# Patient Record
Sex: Male | Born: 1990 | Race: Black or African American | Hispanic: No | Marital: Single | Smoking: Former smoker
Health system: Southern US, Community
[De-identification: ages and names within clinical notes are randomized; demographics above are authoritative.]

## PROBLEM LIST (undated history)

## (undated) DIAGNOSIS — F32A Depression, unspecified: Secondary | ICD-10-CM

## (undated) DIAGNOSIS — F329 Major depressive disorder, single episode, unspecified: Secondary | ICD-10-CM

## (undated) DIAGNOSIS — E109 Type 1 diabetes mellitus without complications: Secondary | ICD-10-CM

## (undated) HISTORY — DX: Type 1 diabetes mellitus without complications: E10.9

---

## 2011-10-13 ENCOUNTER — Inpatient Hospital Stay (HOSPITAL_BASED_OUTPATIENT_CLINIC_OR_DEPARTMENT_OTHER)
Admission: EM | Admit: 2011-10-13 | Discharge: 2011-10-15 | DRG: 638 | Disposition: A | Discharge status: Home / Self Care | Payer: MEDICAID | Attending: Internal Medicine | Admitting: Internal Medicine

## 2011-10-13 ENCOUNTER — Encounter (HOSPITAL_BASED_OUTPATIENT_CLINIC_OR_DEPARTMENT_OTHER): Payer: Self-pay | Admitting: Emergency Medicine

## 2011-10-13 DIAGNOSIS — R634 Abnormal weight loss: Secondary | ICD-10-CM | POA: Diagnosis present

## 2011-10-13 DIAGNOSIS — R7309 Other abnormal glucose: Secondary | ICD-10-CM

## 2011-10-13 DIAGNOSIS — E119 Type 2 diabetes mellitus without complications: Principal | ICD-10-CM

## 2011-10-13 DIAGNOSIS — E860 Dehydration: Secondary | ICD-10-CM | POA: Diagnosis present

## 2011-10-13 DIAGNOSIS — R739 Hyperglycemia, unspecified: Secondary | ICD-10-CM

## 2011-10-13 DIAGNOSIS — R42 Dizziness and giddiness: Secondary | ICD-10-CM | POA: Diagnosis present

## 2011-10-13 DIAGNOSIS — E871 Hypo-osmolality and hyponatremia: Secondary | ICD-10-CM | POA: Diagnosis present

## 2011-10-13 LAB — BASIC METABOLIC PANEL
CO2: 25 mEq/L (ref 19–32)
Chloride: 91 mEq/L — ABNORMAL LOW (ref 96–112)
Glucose, Bld: 759 mg/dL (ref 70–99)
Potassium: 4.7 mEq/L (ref 3.5–5.1)
Sodium: 131 mEq/L — ABNORMAL LOW (ref 135–145)

## 2011-10-13 LAB — CBC WITH DIFFERENTIAL/PLATELET
Hemoglobin: 16.2 g/dL (ref 13.0–17.0)
Lymphocytes Relative: 24 % (ref 12–46)
Lymphs Abs: 1.5 10*3/uL (ref 0.7–4.0)
Monocytes Relative: 10 % (ref 3–12)
Neutrophils Relative %: 66 % (ref 43–77)
Platelets: 182 10*3/uL (ref 150–400)
RBC: 5.81 MIL/uL (ref 4.22–5.81)
WBC: 6.1 10*3/uL (ref 4.0–10.5)

## 2011-10-13 LAB — URINE MICROSCOPIC-ADD ON: RBC / HPF: NONE SEEN RBC/hpf (ref <3)

## 2011-10-13 LAB — URINALYSIS, ROUTINE W REFLEX MICROSCOPIC
Hgb urine dipstick: NEGATIVE
Nitrite: NEGATIVE
Specific Gravity, Urine: 1.038 — ABNORMAL HIGH (ref 1.005–1.030)
Urobilinogen, UA: 0.2 mg/dL (ref 0.0–1.0)
pH: 6 (ref 5.0–8.0)

## 2011-10-13 LAB — GLUCOSE, CAPILLARY: Glucose-Capillary: 426 mg/dL — ABNORMAL HIGH (ref 70–99)

## 2011-10-13 MED ORDER — SODIUM CHLORIDE 0.9 % IV SOLN: INTRAVENOUS | Status: DC

## 2011-10-13 MED ORDER — ONDANSETRON HCL 4 MG/2ML IJ SOLN: 4.0000 mg | Freq: Four times a day (QID) | INTRAMUSCULAR | Status: DC | PRN

## 2011-10-13 MED ORDER — ONDANSETRON HCL 4 MG PO TABS: 4.0000 mg | ORAL_TABLET | Freq: Four times a day (QID) | ORAL | Status: DC | PRN

## 2011-10-13 MED ORDER — SODIUM CHLORIDE 0.9 % IJ SOLN
3.0000 mL | Freq: Two times a day (BID) | INTRAMUSCULAR | Status: DC
  Administered 2011-10-14 – 2011-10-15 (×2): 3 mL via INTRAVENOUS

## 2011-10-13 MED ORDER — SODIUM CHLORIDE 0.9 % IV SOLN
INTRAVENOUS | Status: DC
  Administered 2011-10-13 – 2011-10-14 (×2): via INTRAVENOUS

## 2011-10-13 MED ORDER — DEXTROSE 50 % IV SOLN
25.0000 mL | INTRAVENOUS | Status: DC | PRN
  Administered 2011-10-14: 14 mL via INTRAVENOUS
  Filled 2011-10-13: qty 50

## 2011-10-13 MED ORDER — ONDANSETRON HCL 4 MG/2ML IJ SOLN: 4.0000 mg | Freq: Three times a day (TID) | INTRAMUSCULAR | Status: AC | PRN

## 2011-10-13 MED ORDER — SODIUM CHLORIDE 0.9 % IV SOLN
INTRAVENOUS | Status: DC
  Administered 2011-10-13: via INTRAVENOUS
  Filled 2011-10-13: qty 1

## 2011-10-13 MED ORDER — INSULIN REGULAR BOLUS VIA INFUSION
0.0000 [IU] | Freq: Three times a day (TID) | INTRAVENOUS | Status: DC
  Filled 2011-10-13: qty 10

## 2011-10-13 MED ORDER — SODIUM CHLORIDE 0.9 % IV SOLN
1000.0000 mL | Freq: Once | INTRAVENOUS | Status: AC
  Administered 2011-10-13: 1000 mL via INTRAVENOUS

## 2011-10-13 MED ORDER — DEXTROSE-NACL 5-0.45 % IV SOLN
INTRAVENOUS | Status: DC
  Administered 2011-10-13: 23:00:00 via INTRAVENOUS

## 2011-10-13 MED ORDER — SODIUM CHLORIDE 0.9 % IV SOLN
1000.0000 mL | INTRAVENOUS | Status: DC
  Administered 2011-10-13: 1000 mL via INTRAVENOUS

## 2011-10-13 MED ORDER — INSULIN REGULAR HUMAN 100 UNIT/ML IJ SOLN
INTRAMUSCULAR | Status: AC
  Filled 2011-10-13: qty 1

## 2011-10-13 MED ORDER — SODIUM CHLORIDE 0.9 % IV SOLN
INTRAVENOUS | Status: DC
  Administered 2011-10-13: 7 [IU]/h via INTRAVENOUS

## 2011-10-13 MED ORDER — MORPHINE SULFATE 2 MG/ML IJ SOLN: 2.0000 mg | INTRAMUSCULAR | Status: DC | PRN

## 2011-10-13 NOTE — ED Notes (Addendum)
Critical lab Glucose 759 per Marcello Moores, [employer].

## 2011-10-13 NOTE — ED Provider Notes (Signed)
History     CSN: [id number]  Arrival date & time 10/13/11  1637   First MD Initiated Contact with Patient 10/13/11 1647      Chief Complaint  Patient presents with  . Dizziness  . Palpitations  . Urinary Frequency  . Weight Loss     Patient is a 21 y.o. male presenting with palpitations and frequency.  Palpitations   Urinary Frequency   Pt has been feeling abnormally for at least 2-3 years.  Pt has not had a check up in years.  Over this time he has noticed weight loss over this time.  Pt thinks he has lost weight in his face.  Normally he does weigh 135.  He has had increased thirst, urinating frequently.  He has had palpitations and dizziness over an extended period of time.  Pt felt like he had a panic attack today with his heart racing, feeling lightheaded and vision feeling blurred.  History reviewed. No pertinent past medical history.  History reviewed. No pertinent past surgical history.  FmHx: Mother HTN  History  Substance Use Topics  . Smoking status: Current Some Day Smoker  . Smokeless tobacco: Not on file  . Alcohol Use: Yes      Review of Systems  Cardiovascular: Positive for palpitations.  Genitourinary: Positive for frequency.  All other systems reviewed and are negative.    Allergies  Review of patient's allergies indicates not on file.  Home Medications  No current outpatient prescriptions on file.  BP 118/83  Pulse 89  Temp 97.8 F (36.6 C) (Oral)  Resp 16  Ht 5\' 10"  (1.778 m)  Wt 140 lb (63.504 kg)  BMI 20.09 kg/m2  SpO2 97%  Physical Exam  Nursing note and vitals reviewed. Constitutional: No distress.       thin  HENT:  Head: Normocephalic and atraumatic.  Right Ear: External ear normal.  Left Ear: External ear normal.  Eyes: Conjunctivae are normal. Right eye exhibits no discharge. Left eye exhibits no discharge. No scleral icterus.  Neck: Neck supple. No tracheal deviation present.  Cardiovascular: Normal rate, regular  rhythm and intact distal pulses.   Pulmonary/Chest: Effort normal and breath sounds normal. No stridor. No respiratory distress. He has no wheezes. He has no rales.  Abdominal: Soft. Bowel sounds are normal. He exhibits no distension. There is no tenderness. There is no rebound and no guarding.  Musculoskeletal: He exhibits no edema and no tenderness.  Neurological: He is alert. He has normal strength. No sensory deficit. Cranial nerve deficit:  no gross defecits noted. He exhibits normal muscle tone. He displays no seizure activity. Coordination normal.  Skin: Skin is warm and dry. No rash noted.  Psychiatric: He has a normal mood and affect.    ED Course  Procedures (including critical care time) EKG Sinus rhythm with marked sinus arrhythmia, rate 78 Right atrial enlargement ST elevation, probable early repolarization Borderline ECG No prior tracing to compare  Labs Reviewed  URINALYSIS, ROUTINE W REFLEX MICROSCOPIC - Abnormal; Notable for the following:    Specific Gravity, Urine 1.038 (*)     Glucose, UA >1000 (*)     Ketones, ur 15 (*)     All other components within normal limits  BASIC METABOLIC PANEL - Abnormal; Notable for the following:    Sodium 131 (*)     Chloride 91 (*)     Glucose, Bld 759 (*)     BUN 26 (*)     All other components  within normal limits  CBC WITH DIFFERENTIAL  URINE MICROSCOPIC-ADD ON  HIV ANTIBODY (ROUTINE TESTING)   No results found.   1. Hyperglycemia   2. Diabetes mellitus, new onset       MDM  The patient presents with new-onset diabetes. He does not have evidence of diabetic ketoacidosis however he is profoundly hyperglycemic. The patient does not have an established primary care physician .  He would not be able to followup with her primary care Dr. in the next day or 2 due to the proximity of the weekend. I will consult the hospitalist regarding admission for observation to regulate his blood sugar and to establish him on an  appropriate outpatient regimen to       Celene Kras, MD 10/13/11 1807

## 2011-10-13 NOTE — ED Notes (Signed)
Via carelink--spoke with Travis 

## 2011-10-13 NOTE — ED Notes (Signed)
States for a while has had increased urination, increased thirst, dizziness, palpitations and feelings of "uneasiness"

## 2011-10-13 NOTE — Progress Notes (Addendum)
Transfer Note  Transferring Facility: [facility]  Requesting physician: DR Linwood Dibbles, MD  Time of request: 6:30 pm  HPI: 21 year old male with no significant PMHx came in for dizziness, was found to be hyperglycemic, with Blood sugars in 700. He is apparently not in DKA.  He is a new onset Diabetes.   Vitals: within normal limits  Exam : reported within normal limits.   Plan:  Transfer to [facility] for management of hyperglycemia in a new onset diabetes.  Admit to MED SURG bed.   Kathlen Mody, MD  [facility]  [phone] 10/13/2011, 7:10 PM

## 2011-10-14 DIAGNOSIS — E860 Dehydration: Secondary | ICD-10-CM | POA: Diagnosis present

## 2011-10-14 DIAGNOSIS — R42 Dizziness and giddiness: Secondary | ICD-10-CM | POA: Diagnosis present

## 2011-10-14 DIAGNOSIS — R634 Abnormal weight loss: Secondary | ICD-10-CM | POA: Diagnosis present

## 2011-10-14 LAB — COMPREHENSIVE METABOLIC PANEL
ALT: 9 U/L (ref 0–53)
AST: 13 U/L (ref 0–37)
Albumin: 3.5 g/dL (ref 3.5–5.2)
Alkaline Phosphatase: 96 U/L (ref 39–117)
CO2: 25 mEq/L (ref 19–32)
Chloride: 103 mEq/L (ref 96–112)
Creatinine, Ser: 0.87 mg/dL (ref 0.50–1.35)
GFR calc non Af Amer: >90 mL/min (ref >90)
Potassium: 3.8 mEq/L (ref 3.5–5.1)
Sodium: 140 mEq/L (ref 135–145)
Total Bilirubin: 1.1 mg/dL (ref 0.3–1.2)

## 2011-10-14 LAB — CBC
MCH: 28.5 pg (ref 26.0–34.0)
MCHC: 35.1 g/dL (ref 30.0–36.0)
MCV: 81.1 fL (ref 78.0–100.0)
Platelets: 168 10*3/uL (ref 150–400)
RBC: 4.92 MIL/uL (ref 4.22–5.81)

## 2011-10-14 LAB — GLUCOSE, CAPILLARY
Glucose-Capillary: 121 mg/dL — ABNORMAL HIGH (ref 70–99)
Glucose-Capillary: 127 mg/dL — ABNORMAL HIGH (ref 70–99)
Glucose-Capillary: 183 mg/dL — ABNORMAL HIGH (ref 70–99)
Glucose-Capillary: 201 mg/dL — ABNORMAL HIGH (ref 70–99)
Glucose-Capillary: 295 mg/dL — ABNORMAL HIGH (ref 70–99)

## 2011-10-14 LAB — HEMOGLOBIN A1C: Hgb A1c MFr Bld: 10.1 % — ABNORMAL HIGH (ref <5.7)

## 2011-10-14 MED ORDER — INSULIN ASPART 100 UNIT/ML ~~LOC~~ SOLN
0.0000 [IU] | Freq: Three times a day (TID) | SUBCUTANEOUS | Status: DC
  Administered 2011-10-14: 18:00:00 via SUBCUTANEOUS
  Administered 2011-10-14: 1 [IU] via SUBCUTANEOUS
  Administered 2011-10-14 – 2011-10-15 (×2): 5 [IU] via SUBCUTANEOUS

## 2011-10-14 MED ORDER — INSULIN ASPART 100 UNIT/ML ~~LOC~~ SOLN
0.0000 [IU] | Freq: Every day | SUBCUTANEOUS | Status: DC
  Administered 2011-10-14: 3 [IU] via SUBCUTANEOUS

## 2011-10-14 MED ORDER — LIVING WELL WITH DIABETES BOOK
Freq: Once | Status: AC
  Administered 2011-10-14: 17:00:00
  Filled 2011-10-14: qty 1

## 2011-10-14 MED ORDER — INSULIN GLARGINE 100 UNIT/ML ~~LOC~~ SOLN
5.0000 [IU] | Freq: Every day | SUBCUTANEOUS | Status: DC
  Administered 2011-10-14: 5 [IU] via SUBCUTANEOUS

## 2011-10-14 MED ORDER — INSULIN ASPART 100 UNIT/ML ~~LOC~~ SOLN
2.0000 [IU] | Freq: Three times a day (TID) | SUBCUTANEOUS | Status: DC
  Administered 2011-10-14: 18:00:00 via SUBCUTANEOUS

## 2011-10-14 MED ORDER — SODIUM CHLORIDE 0.9 % IV SOLN
INTRAVENOUS | Status: AC
  Administered 2011-10-14: 15:00:00 via INTRAVENOUS

## 2011-10-14 MED ORDER — INSULIN GLARGINE 100 UNIT/ML ~~LOC~~ SOLN
10.0000 [IU] | Freq: Every day | SUBCUTANEOUS | Status: DC
  Administered 2011-10-14: 10 [IU] via SUBCUTANEOUS

## 2011-10-14 MED ORDER — INSULIN ASPART 100 UNIT/ML ~~LOC~~ SOLN
2.0000 [IU] | Freq: Once | SUBCUTANEOUS | Status: AC
  Administered 2011-10-14: 2 [IU] via SUBCUTANEOUS

## 2011-10-14 MED ORDER — INSULIN ASPART 100 UNIT/ML ~~LOC~~ SOLN
3.0000 [IU] | Freq: Three times a day (TID) | SUBCUTANEOUS | Status: DC
  Administered 2011-10-15: 3 [IU] via SUBCUTANEOUS

## 2011-10-14 NOTE — Progress Notes (Signed)
Subjective: Feeling better.  Blood sugars improved.  Objective: Vital signs in last 24 hours: Filed Vitals:   10/13/11 1912 10/13/11 2021 10/14/11 0143 10/14/11 0626  BP: 112/56 104/72 125/60 105/66  Pulse: 70 81 70 60  Temp:  97.9 F (36.6 C) 98.2 F (36.8 C) 97.8 F (36.6 C)  TempSrc:  Oral Oral Oral  Resp: 16 18 18 18   Height:  5\' 10"  (1.778 m)    Weight:  63.504 kg (140 lb)    SpO2: 100% 93% 100% 98%   Weight change:   Intake/Output Summary (Last 24 hours) at 10/14/11 1143 Last data filed at 10/14/11 0536  Gross per 24 hour  Intake 1406.8 ml  Output    150 ml  Net 1256.8 ml    Physical Exam: General: Awake, Oriented, No acute distress. HEENT: EOMI. Neck: Supple CV: S1 and S2 Lungs: Clear to ascultation bilaterally Abdomen: Soft, Nontender, Nondistended, +bowel sounds. Ext: Good pulses. Trace edema.  Lab Results: Basic Metabolic Panel:  Lab 10/14/11 1610 10/13/11 1705  NA 140 131*  K 3.8 4.7  CL 103 91*  CO2 25 25  GLUCOSE 192* 759*  BUN 17 26*  CREATININE 0.87 1.10  CALCIUM 8.8 10.4  MG -- --  PHOS -- --   Liver Function Tests:  Lab 10/14/11 0500  AST 13  ALT 9  ALKPHOS 96  BILITOT 1.1  PROT 6.4  ALBUMIN 3.5   No results found for this basename: LIPASE:5,AMYLASE:5 in the last 168 hours No results found for this basename: AMMONIA:5 in the last 168 hours CBC:  Lab 10/14/11 0500 10/13/11 1705  WBC 8.0 6.1  NEUTROABS -- 4.0  HGB 14.0 16.2  HCT 39.9 46.4  MCV 81.1 79.9  PLT 168 182   Cardiac Enzymes: No results found for this basename: CKTOTAL:5,CKMB:5,CKMBINDEX:5,TROPONINI:5 in the last 168 hours BNP (last 3 results) No results found for this basename: PROBNP:3 in the last 8760 hours CBG:  Lab 10/14/11 0748 10/14/11 0428 10/14/11 0333 10/14/11 0244 10/14/11 0135  GLUCAP 138* 201* 194* 183* 121*   No results found for this basename: HGBA1C:5 in the last 72 hours Other Labs: No components found with this basename: POCBNP:3 No  results found for this basename: DDIMER:2 in the last 168 hours No results found for this basename: CHOL:2,HDL:2,LDLCALC:2,TRIG:2,CHOLHDL:2,LDLDIRECT:2 in the last 168 hours No results found for this basename: TSH,T4TOTAL,FREET3,T3FREE,FREET4,THYROIDAB in the last 168 hours No results found for this basename: VITAMINB12:2,FOLATE:2,FERRITIN:2,TIBC:2,IRON:2,RETICCTPCT:2 in the last 168 hours  Micro Results: No results found for this or any previous visit (from the past 240 hour(s)).  Studies/Results: No results found.  Medications: I have reviewed the patient's current medications. Scheduled Meds:   . sodium chloride  1,000 mL Intravenous Once   Followed by  . sodium chloride  1,000 mL Intravenous Once  . insulin aspart  0-5 Units Subcutaneous QHS  . insulin aspart  0-9 Units Subcutaneous TID WC  . insulin aspart  2 Units Subcutaneous Once  . insulin glargine  5 Units Subcutaneous QHS  . insulin regular      . living well with diabetes book   Does not apply Once  . sodium chloride  3 mL Intravenous Q12H  . DISCONTD: insulin regular  0-10 Units Intravenous TID WC   Continuous Infusions:   . sodium chloride 150 mL/hr at 10/14/11 0537  . DISCONTD: sodium chloride 1,000 mL (10/13/11 1909)  . DISCONTD: dextrose 5 % and 0.45% NaCl 125 mL/hr at 10/13/11 2248  . DISCONTD: insulin (  NOVOLIN-R) infusion    . DISCONTD: insulin (NOVOLIN-R) infusion 3.3 mL/hr at 10/13/11 2204  . DISCONTD: insulin (NOVOLIN-R) infusion     PRN Meds:.dextrose, morphine injection, ondansetron (ZOFRAN) IV, ondansetron (ZOFRAN) IV, ondansetron  Assessment/Plan: New diagnosis of diabetes mellitus Blood sugars improved.  Was on glucose stabilizer yesterday.  Continue Lantus 5 units each bedtime.  Start NovoLog 2 units with meals.  Given rapid improvement in blood sugars from yesterday to today, suspect patient likely has type 1 diabetes with good response to insulin (not insulin resistant).  Patient may need further  testing for diabetes including possible autoimmune workup, patient may benefit from outpatient referral to endocrinology.  Likely will also need education at the diabetic center for further management.  Dehydration Resolved with IV fluids.  Hyperglycemia Improved with control of diabetes.  Dizziness Due to dehydration resolved.  Weight loss Likely due to diabetes.  Given the patient is started on insulin, weight should improve.  Hyponatremia Resolved.  Prophylaxis SCDs.  Disposition Pending hemoglobin A1c.   LOS: 1 day  Glenn Ball A, MD 10/14/2011, 11:43 AM

## 2011-10-14 NOTE — H&P (Signed)
Glenn Ball is an 21 y.o. male.   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 Patient has been taking lots of fluids especially water but other drinks as well. He noted that things are getting worse especially when he wakes up in the morning. He feels dizzy and then generally has been losing loss of weight lately. He's also been drinking too much water. Symptoms got worse today so he decided to come to the emergency room at [facility] [place]. He was seen and evaluated and found to have a capillary blood glucose of more than 700 his urinalysis also showed concentrated urine with glucose of > 1000. No prior history of diabetes. The only family history of diabetes is his grandmother. He is not in DKA and he is awake alert oriented with no evidence of altered mental status.  History reviewed. No pertinent past medical history.  History reviewed. No pertinent past surgical history.  History reviewed. No pertinent family history. Social History:  reports that he has been smoking.  He does not have any smokeless tobacco history on file. He reports that he drinks alcohol. His drug history not on file.  Allergies:  Allergies  Allergen Reactions  . Sulfa Antibiotics     "just knows he's allergic"    No prescriptions prior to admission    Results for orders placed during the hospital encounter of 10/13/11 (from the past 48 hour(s))  URINALYSIS, ROUTINE W REFLEX MICROSCOPIC     Status: Abnormal   Collection Time   10/13/11  4:40 PM      Component Value Range Comment   Color, Urine YELLOW  YELLOW    APPearance CLEAR  CLEAR    Specific Gravity, Urine 1.038 (*) 1.005 - 1.030    pH 6.0  5.0 - 8.0    Glucose, UA >1000 (*) NEGATIVE mg/dL    Hgb urine dipstick NEGATIVE  NEGATIVE    Bilirubin Urine NEGATIVE  NEGATIVE    Ketones, ur 15 (*) NEGATIVE mg/dL    Protein, ur NEGATIVE  NEGATIVE mg/dL    Urobilinogen, UA 0.2  0.0 - 1.0 mg/dL    Nitrite NEGATIVE  NEGATIVE    Leukocytes, UA NEGATIVE  NEGATIVE   URINE MICROSCOPIC-ADD ON     Status: Normal   Collection Time   10/13/11  4:40 PM      Component Value Range Comment   Squamous Epithelial / LPF RARE  RARE    WBC, UA 0-2  <3 WBC/hpf    RBC / HPF    <3 RBC/hpf    Value: NO FORMED ELEMENTS SEEN ON URINE MICROSCOPIC EXAMINATION   Bacteria, UA RARE  RARE   CBC WITH DIFFERENTIAL     Status: Normal   Collection Time   10/13/11  5:05 PM      Component Value Range Comment   WBC 6.1  4.0 - 10.5 K/uL    RBC 5.81  4.22 - 5.81 MIL/uL    Hemoglobin 16.2  13.0 - 17.0 g/dL    HCT 21.3  08.6 - 57.8 %    MCV 79.9  78.0 - 100.0 fL    MCH 27.9  26.0 - 34.0 pg    MCHC 34.9  30.0 - 36.0 g/dL    RDW 46.9  62.9 - 52.8 %    Platelets  182  150 - 400 K/uL    Neutrophils Relative 66  43 - 77 %    Neutro Abs 4.0  1.7 - 7.7 K/uL    Lymphocytes Relative 24  12 - 46 %    Lymphs Abs 1.5  0.7 - 4.0 K/uL    Monocytes Relative 10  3 - 12 %    Monocytes Absolute 0.6  0.1 - 1.0 K/uL    Eosinophils Relative 1  0 - 5 %    Eosinophils Absolute 0.0  0.0 - 0.7 K/uL    Basophils Relative 1  0 - 1 %    Basophils Absolute 0.0  0.0 - 0.1 K/uL   BASIC METABOLIC PANEL     Status: Abnormal   Collection Time   10/13/11  5:05 PM      Component Value Range Comment   Sodium 131 (*) 135 - 145 mEq/L    Potassium 4.7  3.5 - 5.1 mEq/L    Chloride 91 (*) 96 - 112 mEq/L    CO2 25  19 - 32 mEq/L    Glucose, Bld 759 (*) 70 - 99 mg/dL    BUN 26 (*) 6 - 23 mg/dL    Creatinine, Ser 1.19  0.50 - 1.35 mg/dL    Calcium 14.7  8.4 - 10.5 mg/dL    GFR calc non Af Amer >90  >90 mL/min    GFR calc Af Amer >90  >90 mL/min   GLUCOSE, CAPILLARY     Status: Abnormal   Collection Time   10/13/11  7:18 PM      Component Value Range Comment   Glucose-Capillary 426 (*) 70 -  99 mg/dL   GLUCOSE, CAPILLARY     Status: Abnormal   Collection Time   10/13/11  8:20 PM      Component Value Range Comment   Glucose-Capillary 274 (*) 70 - 99 mg/dL   GLUCOSE, CAPILLARY     Status: Abnormal   Collection Time   10/13/11  9:59 PM      Component Value Range Comment   Glucose-Capillary 224 (*) 70 - 99 mg/dL    No results found.  Review of Systems  Constitutional: Positive for diaphoresis.  HENT: Negative.   Eyes: Positive for blurred vision.  Respiratory: Negative.   Cardiovascular: Negative.   Gastrointestinal: Negative.   Genitourinary: Positive for frequency. Negative for dysuria, urgency, hematuria and flank pain.  Musculoskeletal: Positive for myalgias.  Skin: Negative.   Neurological: Positive for weakness.  Endo/Heme/Allergies: Positive for polydipsia.  Psychiatric/Behavioral: Negative.     Blood pressure 104/72, pulse 81, temperature 97.9 F (36.6 C), temperature source Oral, resp. rate 18, height 5\' 10"  (1.778 m), weight 63.504 kg (140 lb), SpO2 93.00%. Physical Exam  Constitutional: He is oriented to person, place, and time. He appears well-developed and well-nourished.  HENT:  Head: Normocephalic and atraumatic.  Right Ear: External ear normal.  Left Ear: External ear normal.  Nose: Nose normal.  Mouth/Throat: Oropharynx is clear and moist.  Eyes: Conjunctivae and EOM are normal. Pupils are equal, round, and reactive to light.  Neck: Normal range of motion. Neck supple.  Cardiovascular: Normal rate, regular rhythm, normal heart sounds and intact distal pulses.   Respiratory: Effort normal and breath sounds normal.  GI: Soft. Bowel sounds are normal.  Musculoskeletal: Normal range of motion.  Neurological: He is alert and oriented to person, place, and time. He has normal reflexes.  Skin: Skin is warm and dry.  Psychiatric: He  has a normal mood and affect. His behavior is normal. Judgment and thought content normal.     Assessment/Plan A  21 year old gentleman with newly diagnosed diabetes as well as hyperglycemia dehydration.  Plan #1 hyperglycemia: From newly diagnosed diabetes. Patient is currently on glucose stabilizer which we'll use onto his sugars are controlled. More than likely this is type 2 diabetes although the patient is not heavyset. We may be able to transition to oral hypoglycemics prior to discharge. I have given patient and his family is minimal education on diabetes. He will need diabetic education training fully.  #2 dehydration: We'll hydrate him aggressively with saline.  #3 newly diagnosed diabetes: Again patient will be started on diabetic education and home medications will be started with Francesco Sor patient all primary care physician for continued followup. We'll check hemoglobin A1c to see how far or hollow patient has been a diabetic.  #4 dizziness: Most likely secondary to dehydration. Again we'll hydrate the patient and follow closely.  #5 weight loss: Explained to patient that this is most likely secondary to newly diagnosed diabetes. With proper diet control as well as following instructions he she'll stop the weight loss and probably gained some weight.  GARBA,LAWAL 10/14/2011, 12:15 AM

## 2011-10-14 NOTE — Plan of Care (Signed)
Problem: Food- and Nutrition-Related Knowledge Deficit (NB-1.1) Goal: Nutrition education Formal process to instruct or train a patient/client in a skill or to impart knowledge to help patients/clients voluntarily manage or modify food choices and eating behavior to maintain or improve health.  Outcome: Completed/Met Date Met:  10/14/11  RD consulted for nutrition education regarding new onset diabetes. MD unsure if this is type 1 or 2 at this time.      No results found for this basename: HGBA1C    RD provided "Carbohydrate Counting for People with Diabetes" handout from the Academy of Nutrition and Dietetics. Discussed different food groups and their effects on blood sugar, emphasizing carbohydrate-containing foods. Provided list of carbohydrates and recommended serving sizes of common foods.  Discussed importance of controlled and consistent carbohydrate intake throughout the day. Provided examples of ways to balance meals/snacks and encouraged intake of high-fiber, whole grain complex carbohydrates.  Pt and his mother had many questions about diet for DM. RD went over the hand out as well as answered all questions. Pt is at home for the summer but will be going back to college in the fall. Encouraged pt to reach out to RD on campus for more specific meal planning based on what is served at school and for continued education. Pt reports that his usual weight is about 135 -140 lbs, but recently has gotten as low as 120 lbs. 15-20 lb weight loss in unknown amount of time. This is r/t uncontrolled BS and will likely start to gain weight with insulin. Pt appears ready to make dietary changes as he states he wants to feel better, I think he has been having many low blood sugars based on his report of symptoms. Mom seems very helpful and concerned about him being able to manage his meals and medications.  RN setting pt up to view DM videos at this time.  Expect good compliance.  Body mass index is  20.09 kg/(m^2). Pt meets criteria for WNL based on current BMI.  Current diet order is Carb Mod Medium, no meals have been documented at this time. Labs and medications reviewed. No further nutrition interventions warranted at this time. If additional nutrition issues arise, please re-consult RD.  Clarene Duke RD, LDN Pager [phone] After Hours pager [phone]

## 2011-10-14 NOTE — Progress Notes (Signed)
   CARE MANAGEMENT NOTE 10/14/2011  Patient:  Glenn Ball, Glenn Ball   Account Number:  [id number]  Date Initiated:  10/14/2011  Documentation initiated by:  [facility]  Subjective/Objective Assessment:   new onset DM     Action/Plan:   mother, Glenn Ball [phone]   Anticipated DC Date:  10/15/2011   Anticipated DC Plan:  HOME/SELF CARE      DC Planning Services  CM consult  Outpatient Services - Pt will follow up  Medication Assistance      Choice offered to / List presented to:             Status of service:  In process, will continue to follow Medicare Important Message given?   (If response is "NO", the following Medicare IM given date fields will be blank) Date Medicare IM given:   Date Additional Medicare IM given:    Discharge Disposition:  HOME/SELF CARE  Per UR Regulation:    If discussed at Long Length of Stay Meetings, dates discussed:    Comments:  10/14/2011 1235 Provided information on Outpt Nutrition and DM Center on d/c instructions. Instructed pt to call after d/c to set up appt. Provided pt with info on community resources, [employer], and DSS to apply for Medicaid. 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 Plan was go to go back to school on 8/6. 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 NCM explained to pt and mother he will need to discuss with MD about letter to excuse him from band for a few weeks until he is medical stable to participate in activity. Pt states they do have [facility] he can follow up with when he goes back to school. He does not have any drug coverage or insurance coverage. States his tuition and fees cover him seeing college medical care. Contacted main pharmacy and pt eligible for ZZ fund. Will need separate Rx at time of dc to take to [facility] main pharmacy. Isidoro Donning RN CCM Case Mgmt phone [phone]

## 2011-10-15 LAB — GLUCOSE, CAPILLARY: Glucose-Capillary: 111 mg/dL — ABNORMAL HIGH (ref 70–99)

## 2011-10-15 MED ORDER — UNABLE TO FIND: Status: DC

## 2011-10-15 MED ORDER — INSULIN ASPART 100 UNIT/ML ~~LOC~~ SOLN: 4.0000 [IU] | Freq: Three times a day (TID) | SUBCUTANEOUS | Status: DC

## 2011-10-15 MED ORDER — INSULIN GLARGINE 100 UNIT/ML ~~LOC~~ SOLN: 15.0000 [IU] | Freq: Every day | SUBCUTANEOUS | Status: DC

## 2011-10-15 NOTE — Discharge Summary (Signed)
Physician Discharge Summary  Glenn Ball ZOX:[id number] DOB: [birth year] DOA: 10/13/2011  PCP: No primary provider on file. In the process of establishing care with PCP or with student health at the [place].  Admit date: 10/13/2011 Discharge date: 10/15/2011  Recommendations for Outpatient Follow-up:  1. Followup with your PCP in 1 week for management of your new diabetes.  Discharge Diagnoses:  Principal Problem:  *Diabetes mellitus, new onset Active Problems:  Hyperglycemia  Dehydration  Dizziness  Weight loss  Discharge Condition: Stable.  Diet recommendation:  Diabetic diet.  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 Was initally on a glucose stabilizer. Started on Lantus 5 units qhs, titrated Lantus to 15 units QHS bedtime. NovoLog 4 units with meals. Patient may need further testing for diabetes including possible autoimmune workup, patient may benefit from outpatient referral to endocrinology. Will also need education at the diabetic center for further management as outpatient. Hemoglobin A1C 10.1 on 10/14/2011, mean blood sugar of 243.   Dehydration  Resolved with IV fluids.   Hyperglycemia  Improved with control of diabetes.   Dizziness  Due to dehydration resolved.   Weight loss  Likely due to diabetes. Given the patient is started on insulin, weight should improve.   Hyponatremia  Resolved.  Procedures:  None  Consultations:  None  Discharge Exam: Filed Vitals:   10/15/11 0618  BP: 110/59  Pulse: 58  Temp: 97.2 F (36.2 C)  Resp: 20   Filed Vitals:   10/14/11 0626 10/14/11 1433 10/14/11 2100 10/15/11 0618  BP: 105/66 133/62 123/71  110/59  Pulse: 60 58 56 58  Temp: 97.8 F (36.6 C) 98 F (36.7 C) 97.8 F (36.6 C) 97.2 F (36.2 C)  TempSrc: Oral Oral Oral Oral  Resp: 18 18  20   Height:      Weight:      SpO2: 98% 100% 94% 100%    Discharge Instructions   Medication List  As of 10/15/2011 11:23 AM   TAKE these medications         insulin aspart 100 UNIT/ML injection   Commonly known as: novoLOG   Inject 4 Units into the skin 3 (three) times daily with meals. 3 ml x 2 pens.  10 ml x2 vials  Patient preference.      insulin glargine 100 UNIT/ML injection   Commonly known as: LANTUS   Inject 15 Units into the skin at bedtime. 3 ml x 2 pens.  10 ml x2 vials  Patient preference.      UNABLE TO FIND   Med Name:  120 syringes and needles for insulin      UNABLE TO FIND   Please excuse Mr. Casmere Hollenbeck from any work or school obligations from 10/13/2011 till 10/29/2011 as he was hospitalized.           Follow-up Information    [facility] (Please call to schedule appointment with clinic on 10/15/2011. )    Contact information:   [address] [place] [address] [phone]         The results of significant diagnostics from this hospitalization (including imaging, microbiology, ancillary and laboratory) are listed below for reference.  Significant Diagnostic Studies: No results found.  Microbiology: No results found for this or any previous visit (from the past 240 hour(s)).   Labs: Basic Metabolic Panel:  Lab 10/14/11 5784 10/13/11 1705  NA 140 131*  K 3.8 4.7  CL 103 91*  CO2 25 25  GLUCOSE 192* 759*  BUN 17 26*  CREATININE 0.87 1.10  CALCIUM 8.8 10.4  MG -- --  PHOS -- --   Liver Function Tests:  Lab 10/14/11 0500  AST 13  ALT 9  ALKPHOS 96  BILITOT 1.1  PROT 6.4  ALBUMIN 3.5   No results found for this basename: LIPASE:5,AMYLASE:5 in the last 168 hours No results found for this basename:  AMMONIA:5 in the last 168 hours CBC:  Lab 10/14/11 0500 10/13/11 1705  WBC 8.0 6.1  NEUTROABS -- 4.0  HGB 14.0 16.2  HCT 39.9 46.4  MCV 81.1 79.9  PLT 168 182   Cardiac Enzymes: No results found for this basename: CKTOTAL:5,CKMB:5,CKMBINDEX:5,TROPONINI:5 in the last 168 hours BNP: BNP (last 3 results) No results found for this basename: PROBNP:3 in the last 8760 hours CBG:  Lab 10/15/11 0740 10/14/11 2239 10/14/11 1706 10/14/11 1203 10/14/11 0748  GLUCAP 268* 291* 266* 295* 138*    Time coordinating discharge: 35 minutes  Signed:  Tanazia Achee A  [facility] 10/15/2011, 11:23 AM

## 2011-10-15 NOTE — Progress Notes (Signed)
Subjective: Feeling better.  No specific concerns.  Objective: Vital signs in last 24 hours: Filed Vitals:   10/14/11 0626 10/14/11 1433 10/14/11 2100 10/15/11 0618  BP: 105/66 133/62 123/71 110/59  Pulse: 60 58 56 58  Temp: 97.8 F (36.6 C) 98 F (36.7 C) 97.8 F (36.6 C) 97.2 F (36.2 C)  TempSrc: Oral Oral Oral Oral  Resp: 18 18  20   Height:      Weight:      SpO2: 98% 100% 94% 100%   Weight change:   Intake/Output Summary (Last 24 hours) at 10/15/11 1108 Last data filed at 10/15/11 0900  Gross per 24 hour  Intake   3062 ml  Output   2075 ml  Net    987 ml    Physical Exam: General: Awake, Oriented, No acute distress. HEENT: EOMI. Neck: Supple CV: S1 and S2 Lungs: Clear to ascultation bilaterally Abdomen: Soft, Nontender, Nondistended, +bowel sounds. Ext: Good pulses. Trace edema.  Lab Results: Basic Metabolic Panel:  Lab 10/14/11 2956 10/13/11 1705  NA 140 131*  K 3.8 4.7  CL 103 91*  CO2 25 25  GLUCOSE 192* 759*  BUN 17 26*  CREATININE 0.87 1.10  CALCIUM 8.8 10.4  MG -- --  PHOS -- --   Liver Function Tests:  Lab 10/14/11 0500  AST 13  ALT 9  ALKPHOS 96  BILITOT 1.1  PROT 6.4  ALBUMIN 3.5   No results found for this basename: LIPASE:5,AMYLASE:5 in the last 168 hours No results found for this basename: AMMONIA:5 in the last 168 hours CBC:  Lab 10/14/11 0500 10/13/11 1705  WBC 8.0 6.1  NEUTROABS -- 4.0  HGB 14.0 16.2  HCT 39.9 46.4  MCV 81.1 79.9  PLT 168 182   Cardiac Enzymes: No results found for this basename: CKTOTAL:5,CKMB:5,CKMBINDEX:5,TROPONINI:5 in the last 168 hours BNP (last 3 results) No results found for this basename: PROBNP:3 in the last 8760 hours CBG:  Lab 10/15/11 0740 10/14/11 2239 10/14/11 1706 10/14/11 1203 10/14/11 0748  GLUCAP 268* 291* 266* 295* 138*    Basename 10/14/11 0500  HGBA1C 10.1*   Other Labs: No components found with this basename: POCBNP:3 No results found for this basename: DDIMER:2 in  the last 168 hours No results found for this basename: CHOL:2,HDL:2,LDLCALC:2,TRIG:2,CHOLHDL:2,LDLDIRECT:2 in the last 168 hours  Lab 10/14/11 0500  TSH 0.969  T4TOTAL --  T3FREE --  FREET4 --  THYROIDAB --   No results found for this basename: VITAMINB12:2,FOLATE:2,FERRITIN:2,TIBC:2,IRON:2,RETICCTPCT:2 in the last 168 hours  Micro Results: No results found for this or any previous visit (from the past 240 hour(s)).  Studies/Results: No results found.  Medications: I have reviewed the patient's current medications. Scheduled Meds:    . insulin aspart  0-5 Units Subcutaneous QHS  . insulin aspart  0-9 Units Subcutaneous TID WC  . insulin aspart  4 Units Subcutaneous TID WC  . insulin glargine  15 Units Subcutaneous QHS  . living well with diabetes book   Does not apply Once  . sodium chloride  3 mL Intravenous Q12H  . DISCONTD: insulin aspart  2 Units Subcutaneous TID WC  . DISCONTD: insulin aspart  3 Units Subcutaneous TID WC  . DISCONTD: insulin glargine  10 Units Subcutaneous QHS  . DISCONTD: insulin glargine  5 Units Subcutaneous QHS   Continuous Infusions:    . sodium chloride 75 mL/hr at 10/14/11 1450  . DISCONTD: sodium chloride 150 mL/hr at 10/14/11 0537   PRN Meds:.dextrose, morphine injection,  ondansetron (ZOFRAN) IV, ondansetron  Assessment/Plan: New diagnosis of diabetes mellitus Blood sugars improved.  Was initally on a glucose stabilizer.  Continue Lantus 15 units QHS bedtime.  Continue NovoLog 4 units with meals.  Patient may need further testing for diabetes including possible autoimmune workup, patient may benefit from outpatient referral to endocrinology.  Will also need education at the diabetic center for further management. Discharge patient today. Hemoglobin A1C 10.1 on 10/14/2011, mean blood sugar of 243.  Dehydration Resolved with IV fluids.  Hyperglycemia Improved with control of diabetes.  Dizziness Due to dehydration resolved.  Weight  loss Likely due to diabetes.  Given the patient is started on insulin, weight should improve.  Hyponatremia Resolved.  Prophylaxis SCDs.  Disposition Discharge patient today.   LOS: 2 days  Tyaisha Cullom A, MD 10/15/2011, 11:08 AM

## 2011-10-15 NOTE — Progress Notes (Signed)
10/15/2011 1230 Used [employer] funds for Insulin, Lantus and Novolog. Pt received note from d/c MD for school. Isidoro Donning RN CCM Case Mgmt phone [phone]

## 2011-10-17 ENCOUNTER — Encounter: Payer: Self-pay | Attending: Internal Medicine | Admitting: *Deleted

## 2011-10-17 ENCOUNTER — Encounter: Payer: Self-pay | Admitting: *Deleted

## 2011-10-17 VITALS — Ht 70.0 in | Wt 130.0 lb

## 2011-10-17 DIAGNOSIS — E119 Type 2 diabetes mellitus without complications: Secondary | ICD-10-CM | POA: Insufficient documentation

## 2011-10-17 DIAGNOSIS — Z713 Dietary counseling and surveillance: Secondary | ICD-10-CM | POA: Insufficient documentation

## 2011-10-17 NOTE — Progress Notes (Signed)
  Medical Nutrition Therapy:  Appt start time: 1130 end time:  1230.  Assessment:  Primary concerns today: newly diagnosed with Type 1 diabetes. He states he was diagnosed last week and spent a few days in the hospital. He has questions about what foods to eat and how many. He states he is doing fine with insulin administration so far. He also states he has a scholarship to [employer] in [place] and is very active with his music both at school and his church. Current weight has almost returned to his normal weight of 135 pounds however he would like to weigh closer to 155 pounds eventually.   MEDICATIONS: see list. Diabetes medications include Lantus @ 15 units at bedtime and Novolog @ 4 units before each meal   DIETARY INTAKE:  Usual eating pattern includes 2-3 meals and 1-2 snacks per day.  Everyday foods include foods available in school cafeteria.  Avoided foods include: none stated.    24-hr recall:  B ( AM): skip before diagnosis Snk ( AM): none stated  L ( PM): cafe at school burger or sandwich, occasionally fries, juice or regular Sprite Snk ( PM): none D ( PM): school cafe: variety of foods, 1-2 plates of food Snk ( PM): oodles of noodles, variety of snacks, or fast food Beverages: mostly fruit juice or regular clear soda  Usual physical activity: very active with band, especially marching and basketball with friends at night  Estimated energy needs: 3000 calories 340 g carbohydrates 224 g protein 84 g fat  Progress Towards Goal(s):  In progress.   Nutritional Diagnosis:  NB-1.1 Food and nutrition-related knowledge deficit As related to newly diagnosed DM1.  As evidenced by A1c of 10.1%.    Intervention:  Nutrition counseling and diabetes education initiated. Discussed basic physiology of diabetes, SMBG and rationale of checking BG at alternate times of day, A1c, Carb Counting and reading food labels, and benefits of increased activity. Discussed potential  of adjusting insulin doses based on increased activity of marching and availability of insulin pumps later on.  Also spent additional time discussing psychological effects of having diabetes and dealing with life situations as he adjusts to his new disease.   Goals:  Read food labels for Total Carbohydrate of foods  Eat 3 meals and snacks if hungry  Aim for 5 Carb Choices ( 60 grams carbohydrate) per meal +/- 1 either way  Aim for 2 Carb Choices ( 30 grams carbohydrate) per/snack  Continue to take insulin as directed by MD  Monitor glucose levels as instructed by your doctor  Continue with current  physical activity of marching with band as tolerated  Bring food record and glucose log to your next nutrition visit  Handouts given during visit include: Living Well with Diabetes Carb Counting and Food Label handouts Meal Plan Card  APP information for [employer]  Monitoring/Evaluation:  Dietary intake, exercise, reading food labels, insulin administration and body weight prn. 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 Also provided list of MD's in [place] area that work with patients with DM1 per his request.

## 2011-10-17 NOTE — Patient Instructions (Addendum)
Goals:  Follow Diabetes Meal Plan as instructed  Eat 3 meals and snacks if hungry  Aim for 5 Carb Choices ( 60 grams carbohydrate) per meal +/- 1 either way  Aim for 2 Carb Choices ( 30 grams carbohydrate) per/snack  Continue to take insulin as directed by MD  Monitor glucose levels as instructed by your doctor  Continue with current  physical activity of marching with band as tolerated  Bring food record and glucose log to your next nutrition visit

## 2016-02-07 DIAGNOSIS — E1065 Type 1 diabetes mellitus with hyperglycemia: Secondary | ICD-10-CM | POA: Insufficient documentation

## 2016-02-07 DIAGNOSIS — F32A Depression, unspecified: Secondary | ICD-10-CM | POA: Insufficient documentation

## 2016-02-07 DIAGNOSIS — Z23 Encounter for immunization: Secondary | ICD-10-CM | POA: Insufficient documentation

## 2016-02-07 DIAGNOSIS — Z202 Contact with and (suspected) exposure to infections with a predominantly sexual mode of transmission: Secondary | ICD-10-CM | POA: Insufficient documentation

## 2016-02-07 DIAGNOSIS — Z0000 Encounter for general adult medical examination without abnormal findings: Secondary | ICD-10-CM | POA: Insufficient documentation

## 2016-02-21 ENCOUNTER — Emergency Department (HOSPITAL_BASED_OUTPATIENT_CLINIC_OR_DEPARTMENT_OTHER)
Admission: EM | Admit: 2016-02-21 | Discharge: 2016-02-21 | Disposition: A | Discharge status: Home / Self Care | Payer: 59 | Attending: Emergency Medicine | Admitting: Emergency Medicine

## 2016-02-21 ENCOUNTER — Encounter (HOSPITAL_BASED_OUTPATIENT_CLINIC_OR_DEPARTMENT_OTHER): Payer: Self-pay | Admitting: *Deleted

## 2016-02-21 DIAGNOSIS — E109 Type 1 diabetes mellitus without complications: Secondary | ICD-10-CM | POA: Insufficient documentation

## 2016-02-21 DIAGNOSIS — F17200 Nicotine dependence, unspecified, uncomplicated: Secondary | ICD-10-CM | POA: Insufficient documentation

## 2016-02-21 DIAGNOSIS — J3489 Other specified disorders of nose and nasal sinuses: Secondary | ICD-10-CM | POA: Insufficient documentation

## 2016-02-21 DIAGNOSIS — R5383 Other fatigue: Secondary | ICD-10-CM | POA: Diagnosis not present

## 2016-02-21 DIAGNOSIS — R630 Anorexia: Secondary | ICD-10-CM | POA: Insufficient documentation

## 2016-02-21 DIAGNOSIS — Z79899 Other long term (current) drug therapy: Secondary | ICD-10-CM | POA: Diagnosis not present

## 2016-02-21 DIAGNOSIS — Z794 Long term (current) use of insulin: Secondary | ICD-10-CM | POA: Insufficient documentation

## 2016-02-21 HISTORY — DX: Depression, unspecified: F32.A

## 2016-02-21 HISTORY — DX: Major depressive disorder, single episode, unspecified: F32.9

## 2016-02-21 LAB — CBC WITH DIFFERENTIAL/PLATELET
BASOS ABS: 0.1 10*3/uL (ref 0.0–0.1)
BASOS PCT: 1 %
EOS ABS: 0.1 10*3/uL (ref 0.0–0.7)
EOS PCT: 2 %
HCT: 46.5 % (ref 39.0–52.0)
Hemoglobin: 15.5 g/dL (ref 13.0–17.0)
LYMPHS PCT: 36 %
Lymphs Abs: 2 10*3/uL (ref 0.7–4.0)
MCH: 28.4 pg (ref 26.0–34.0)
MCHC: 33.3 g/dL (ref 30.0–36.0)
MCV: 85.2 fL (ref 78.0–100.0)
MONO ABS: 0.4 10*3/uL (ref 0.1–1.0)
Monocytes Relative: 7 %
Neutro Abs: 2.9 10*3/uL (ref 1.7–7.7)
Neutrophils Relative %: 54 %
PLATELETS: 225 10*3/uL (ref 150–400)
RBC: 5.46 MIL/uL (ref 4.22–5.81)
RDW: 12.6 % (ref 11.5–15.5)
WBC: 5.5 10*3/uL (ref 4.0–10.5)

## 2016-02-21 LAB — URINALYSIS, ROUTINE W REFLEX MICROSCOPIC
BILIRUBIN URINE: NEGATIVE
Glucose, UA: 250 mg/dL — AB
Hgb urine dipstick: NEGATIVE
KETONES UR: NEGATIVE mg/dL
LEUKOCYTES UA: NEGATIVE
NITRITE: NEGATIVE
PH: 5.5 (ref 5.0–8.0)
PROTEIN: 30 mg/dL — AB
Specific Gravity, Urine: 1.03 (ref 1.005–1.030)

## 2016-02-21 LAB — URINE MICROSCOPIC-ADD ON

## 2016-02-21 LAB — COMPREHENSIVE METABOLIC PANEL
ALBUMIN: 4.4 g/dL (ref 3.5–5.0)
ALK PHOS: 72 U/L (ref 38–126)
ALT: 17 U/L (ref 17–63)
AST: 21 U/L (ref 15–41)
Anion gap: 8 (ref 5–15)
BILIRUBIN TOTAL: 1.5 mg/dL — AB (ref 0.3–1.2)
BUN: 15 mg/dL (ref 6–20)
CALCIUM: 9.7 mg/dL (ref 8.9–10.3)
CO2: 25 mmol/L (ref 22–32)
Chloride: 104 mmol/L (ref 101–111)
Creatinine, Ser: 0.79 mg/dL (ref 0.61–1.24)
GFR calc Af Amer: >60 mL/min (ref >60)
GLUCOSE: 196 mg/dL — AB (ref 65–99)
POTASSIUM: 4.1 mmol/L (ref 3.5–5.1)
Sodium: 137 mmol/L (ref 135–145)
TOTAL PROTEIN: 7.6 g/dL (ref 6.5–8.1)

## 2016-02-21 LAB — CBG MONITORING, ED: GLUCOSE-CAPILLARY: 186 mg/dL — AB (ref 65–99)

## 2016-02-21 MED ORDER — SODIUM CHLORIDE 0.9 % IV BOLUS (SEPSIS)
1000.0000 mL | Freq: Once | INTRAVENOUS | Status: AC
  Administered 2016-02-21: 1000 mL via INTRAVENOUS

## 2016-02-21 NOTE — Discharge Instructions (Signed)
Your fatigue and weight loss is likely due to your diabetes.  Follow up closely with your endocrinologist for further management.  Eat appropriately and monitor your blood sugar closely.

## 2016-02-21 NOTE — ED Provider Notes (Signed)
[facility] Provider Note   CSN: [id number] Arrival date & time: 02/21/16  1428 By signing my name below, I, Glenn Ball, attest that this documentation has been prepared under the direction and in the presence of [name], PA-C. Electronically Signed: Linus Ball, ED Scribe. 02/21/16. 4:20 PM.  History   Chief Complaint Chief Complaint  Patient presents with  . Fatigue   The history is provided by the patient. No language interpreter was used.   HPI Comments: Glenn Ball is a 25 y.o. male who presents to the Emergency Department with a PMHx of Type I DM and HDL complaining of fatigue that began this morning. Pt also reports polydipsia, rhinorrhea, and decreased appetite. Pt states he lost 7 pounds in past 2 days. In addition, he reports his CBG has been in the 500s.He states his diet is irregular and has to eat on the go. He drink alcohol socially. Pt denies cough, CP, SOB, abd pain, back pain, vomiting, diarrhea, dysuria, or any other symptoms at this time. Pt denies sick contacts. Pt is complaint with all of his medications.   Last saw endocrinologist a couple of months ago. Dr. Riley Nearing, PCP advised to increased his insulin levels.    Past Medical History:  Diagnosis Date  . Depression   . Diabetes mellitus     Patient Active Problem List   Diagnosis Date Noted  . Dehydration 10/14/2011  . Dizziness 10/14/2011  . Weight loss 10/14/2011  . Diabetes mellitus, new onset (HCC) 10/13/2011  . Hyperglycemia 10/13/2011    History reviewed. No pertinent surgical history.     Home Medications    Prior to Admission medications   Medication Sig Start Date End Date Taking? Authorizing Provider  Escitalopram Oxalate (LEXAPRO PO) Take by mouth.   Yes Historical Provider, MD  SIMVASTATIN PO Take by mouth.   Yes Historical Provider, MD  insulin aspart (NOVOLOG) 100 UNIT/ML injection Inject 4 Units into the skin 3 (three) times daily with meals. 3 ml x 2 pens. 10  ml x2 vials Patient preference. 10/15/11 10/14/12  [name], MD  insulin glargine (LANTUS) 100 UNIT/ML injection Inject 15 Units into the skin at bedtime. 3 ml x 2 pens. 10 ml x2 vials Patient preference. 10/15/11 10/14/12  [name], MD  UNABLE TO FIND Med Name: 120 syringes and needles for insulin 10/15/11   [name], MD  UNABLE TO FIND Please excuse Mr. Glenn Ball from any work or school obligations from 10/13/2011 till 10/29/2011 as he was hospitalized. 10/15/11   [name], MD    Family History No family history on file.  Social History Social History  Substance Use Topics  . Smoking status: Current Some Day Smoker  . Smokeless tobacco: Never Used  . Alcohol use No     Allergies   Sulfa antibiotics   Review of Systems Review of Systems  Constitutional: Positive for appetite change (decreased) and fatigue.  HENT: Positive for rhinorrhea.   Respiratory: Negative for cough and shortness of breath.   Cardiovascular: Negative for chest pain.  Gastrointestinal: Negative for abdominal pain, diarrhea and vomiting.  Endocrine: Positive for polydipsia.  Genitourinary: Negative for dysuria.  Musculoskeletal: Negative for back pain.  All other systems reviewed and are negative.    Physical Exam Updated Vital Signs BP 108/69   Pulse 78   Temp 98 F (36.7 C) (Oral)   Resp 20   Ht 5\' 8"  (1.727 m)   Wt 134 lb (60.8  kg)   SpO2 96%   BMI 20.37 kg/m   Physical Exam  Constitutional: He is oriented to person, place, and time. He appears well-developed and well-nourished.  HENT:  Head: Normocephalic.  Mouth/Throat: Oropharynx is clear and moist.  Eyes: Conjunctivae are normal.  Cardiovascular: Normal rate, regular rhythm and normal heart sounds.   Pulmonary/Chest: Effort normal and breath sounds normal. No respiratory distress.  Abdominal: Soft. He exhibits no distension and no mass. There is no tenderness. There is no rebound and no guarding.    Musculoskeletal: He exhibits no edema.  Neurological: He is alert and oriented to person, place, and time. GCS eye subscore is 4. GCS verbal subscore is 5. GCS motor subscore is 6.  5/5 strength to all 4 extremities  Skin: Skin is warm and dry.  Psychiatric: He has a normal mood and affect.  Nursing note and vitals reviewed.  ED Treatments / Results  DIAGNOSTIC STUDIES: Oxygen Saturation is 96% on room air, normal by my interpretation.    COORDINATION OF CARE: 4:20 PM Discussed treatment plan with pt including laboratory studies at bedside and pt agreed to plan.  Labs (all labs ordered are listed, but only abnormal results are displayed) Labs Reviewed  COMPREHENSIVE METABOLIC PANEL - Abnormal; Notable for the following:       Result Value   Glucose, Bld 196 (*)    Total Bilirubin 1.5 (*)    All other components within normal limits  URINALYSIS, ROUTINE W REFLEX MICROSCOPIC (NOT AT [facility]) [facility]; Notable for the following:    Glucose, UA 250 (*)    Protein, ur 30 (*)    All other components within normal limits  URINE MICROSCOPIC-ADD ON - Abnormal; Notable for the following:    Squamous Epithelial / LPF 0-5 (*)    Bacteria, UA FEW (*)    Crystals CA OXALATE CRYSTALS (*)    All other components within normal limits  CBG MONITORING, ED - Abnormal; Notable for the following:    Glucose-Capillary 186 (*)    All other components within normal limits  CBC WITH DIFFERENTIAL/PLATELET    EKG  EKG Interpretation None       Radiology No results found.  Procedures Procedures (including critical care time)  Medications Ordered in ED Medications  sodium chloride 0.9 % bolus 1,000 mL (1,000 mLs Intravenous New Bag/Given 02/21/16 1657)     Initial Impression / Assessment and Plan / ED Course  I have reviewed the triage vital signs and the nursing notes.  Pertinent labs & imaging results that were available during my care of the patient were reviewed by me and considered  in my medical decision making (see chart for details).  Clinical Course     BP 115/63 (BP Location: Right Arm)   Pulse 62   Temp 98 F (36.7 C) (Oral)   Resp 18   Ht 5\' 8"  (1.727 m)   Wt 60.8 kg   SpO2 100%   BMI 20.37 kg/m    Final Clinical Impressions(s) / ED Diagnoses   Final diagnoses:  Fatigue, unspecified type    New Prescriptions New Prescriptions   No medications on file   I personally performed the services described in this documentation, which was scribed in my presence. The recorded information has been reviewed and is accurate.   Pt with hx of Type1 diabetes not well controlled, here with generalized fatigue and weight loss.  Labs are reassuring, mild hyperglycemia but no DKA.  No infectious sxs.  Recommend f/u closely with endocrinologist for further care.      Fayrene HelperBowie Jet Traynham, PA-C 02/21/16 1819    Canary Brimhristopher J Tegeler, MD 02/22/16 [id number]

## 2016-02-21 NOTE — ED Triage Notes (Signed)
Fatigue for a few days. He has lost weight.

## 2016-09-12 ENCOUNTER — Emergency Department (HOSPITAL_BASED_OUTPATIENT_CLINIC_OR_DEPARTMENT_OTHER)
Admission: EM | Admit: 2016-09-12 | Discharge: 2016-09-12 | Disposition: A | Discharge status: Home / Self Care | Payer: 59 | Attending: Emergency Medicine | Admitting: Emergency Medicine

## 2016-09-12 ENCOUNTER — Encounter (HOSPITAL_BASED_OUTPATIENT_CLINIC_OR_DEPARTMENT_OTHER): Payer: Self-pay | Admitting: *Deleted

## 2016-09-12 DIAGNOSIS — E119 Type 2 diabetes mellitus without complications: Secondary | ICD-10-CM | POA: Insufficient documentation

## 2016-09-12 DIAGNOSIS — R519 Headache, unspecified: Secondary | ICD-10-CM

## 2016-09-12 DIAGNOSIS — R531 Weakness: Secondary | ICD-10-CM | POA: Insufficient documentation

## 2016-09-12 DIAGNOSIS — F17200 Nicotine dependence, unspecified, uncomplicated: Secondary | ICD-10-CM | POA: Insufficient documentation

## 2016-09-12 DIAGNOSIS — R51 Headache: Secondary | ICD-10-CM | POA: Insufficient documentation

## 2016-09-12 LAB — COMPREHENSIVE METABOLIC PANEL
ALT: 14 U/L — AB (ref 17–63)
ANION GAP: 8 (ref 5–15)
AST: 20 U/L (ref 15–41)
Albumin: 3.9 g/dL (ref 3.5–5.0)
Alkaline Phosphatase: 60 U/L (ref 38–126)
BUN: 15 mg/dL (ref 6–20)
CHLORIDE: 102 mmol/L (ref 101–111)
CO2: 28 mmol/L (ref 22–32)
CREATININE: 1 mg/dL (ref 0.61–1.24)
Calcium: 8.8 mg/dL — ABNORMAL LOW (ref 8.9–10.3)
Glucose, Bld: 102 mg/dL — ABNORMAL HIGH (ref 65–99)
POTASSIUM: 3.6 mmol/L (ref 3.5–5.1)
SODIUM: 138 mmol/L (ref 135–145)
Total Bilirubin: 2.4 mg/dL — ABNORMAL HIGH (ref 0.3–1.2)
Total Protein: 6.6 g/dL (ref 6.5–8.1)

## 2016-09-12 LAB — CBC WITH DIFFERENTIAL/PLATELET
Basophils Absolute: 0 10*3/uL (ref 0.0–0.1)
Basophils Relative: 0 %
Eosinophils Absolute: 0.1 10*3/uL (ref 0.0–0.7)
Eosinophils Relative: 2 %
HEMATOCRIT: 42 % (ref 39.0–52.0)
HEMOGLOBIN: 14.7 g/dL (ref 13.0–17.0)
LYMPHS ABS: 1.5 10*3/uL (ref 0.7–4.0)
Lymphocytes Relative: 25 %
MCH: 28.8 pg (ref 26.0–34.0)
MCHC: 35 g/dL (ref 30.0–36.0)
MCV: 82.4 fL (ref 78.0–100.0)
Monocytes Absolute: 0.5 10*3/uL (ref 0.1–1.0)
Monocytes Relative: 9 %
NEUTROS ABS: 3.8 10*3/uL (ref 1.7–7.7)
NEUTROS PCT: 64 %
Platelets: 185 10*3/uL (ref 150–400)
RBC: 5.1 MIL/uL (ref 4.22–5.81)
RDW: 12.1 % (ref 11.5–15.5)
WBC: 5.9 10*3/uL (ref 4.0–10.5)

## 2016-09-12 LAB — CBG MONITORING, ED: GLUCOSE-CAPILLARY: 103 mg/dL — AB (ref 65–99)

## 2016-09-12 MED ORDER — SODIUM CHLORIDE 0.9 % IV BOLUS (SEPSIS)
1000.0000 mL | Freq: Once | INTRAVENOUS | Status: AC
  Administered 2016-09-12: 1000 mL via INTRAVENOUS

## 2016-09-12 NOTE — ED Provider Notes (Signed)
[facility] Provider Note   CSN: [id number] Arrival date & time: 09/12/16  1444     History   Chief Complaint Chief Complaint  Patient presents with  . Headache    HPI Glenn Ball is a 26 y.o. male.  The history is provided by the patient. No language interpreter was used.  Headache     Glenn Ball is a 26 y.o. male who presents to the Emergency Department complaining of malaise.  He has a history of type 1 diabetes and has been taking his insulin as prescribed. He has been out of his test strips and does not have insurance to help him pay for more. This morning he awoke and felt poorly with malaise and headache and he felt like his sugar was low. He ate a Pop Tart prior to coming to the emergency department and states that his headache is now gone but he still feels slightly lightheaded and unwell. Headache was similar to prior headaches in the past. No fevers, vomiting, chest pain, shortness of breath, sore throat, belly pain, diarrhea, dysuria. Symptoms are moderate and improving in nature. Past Medical History:  Diagnosis Date  . Depression   . Diabetes mellitus     Patient Active Problem List   Diagnosis Date Noted  . Dehydration 10/14/2011  . Dizziness 10/14/2011  . Weight loss 10/14/2011  . Diabetes mellitus, new onset (HCC) 10/13/2011  . Hyperglycemia 10/13/2011    History reviewed. No pertinent surgical history.     Home Medications    Prior to Admission medications   Medication Sig Start Date End Date Taking? Authorizing Provider  insulin aspart (NOVOLOG) 100 UNIT/ML injection Inject 4 Units into the skin 3 (three) times daily with meals. 3 ml x 2 pens. 10 ml x2 vials Patient preference. 10/15/11 10/14/12  [name], MD  insulin glargine (LANTUS) 100 UNIT/ML injection Inject 15 Units into the skin at bedtime. 3 ml x 2 pens. 10 ml x2 vials Patient preference. 10/15/11 10/14/12  [name], MD  UNABLE TO FIND Med Name: 120 syringes and  needles for insulin 10/15/11   [name], MD  UNABLE TO FIND Please excuse Glenn Ball from any work or school obligations from 10/13/2011 till 10/29/2011 as he was hospitalized. 10/15/11   [name], MD    Family History History reviewed. No pertinent family history.  Social History Social History  Substance Use Topics  . Smoking status: Current Some Day Smoker  . Smokeless tobacco: Never Used  . Alcohol use No     Allergies   Sulfa antibiotics   Review of Systems Review of Systems  Neurological: Positive for headaches.  All other systems reviewed and are negative.    Physical Exam Updated Vital Signs BP 98/60 (BP Location: Left Arm)   Pulse 85   Temp 98.2 F (36.8 C) (Oral)   Resp 18   Ht 5\' 10"  (1.778 m)   Wt 67.6 kg (149 lb)   SpO2 100%   BMI 21.38 kg/m   Physical Exam  Constitutional: He is oriented to person, place, and time. He appears well-developed and well-nourished.  HENT:  Head: Normocephalic and atraumatic.  Mouth/Throat: Oropharynx is clear and moist.  Eyes: EOM are normal. Pupils are equal, round, and reactive to light.  Neck: Neck supple.  Cardiovascular: Normal rate and regular rhythm.   No murmur heard. Pulmonary/Chest: Effort normal and breath sounds normal. No respiratory distress.  Abdominal: Soft. There is no tenderness. There  is no rebound and no guarding.  Musculoskeletal: He exhibits no edema or tenderness.  Neurological: He is alert and oriented to person, place, and time.  Skin: Skin is warm and dry.  Psychiatric: He has a normal mood and affect. His behavior is normal.  Nursing note and vitals reviewed.    ED Treatments / Results  Labs (all labs ordered are listed, but only abnormal results are displayed) Labs Reviewed  COMPREHENSIVE METABOLIC PANEL - Abnormal; Notable for the following:       Result Value   Glucose, Bld 102 (*)    Calcium 8.8 (*)    ALT 14 (*)    Total Bilirubin 2.4 (*)    All other  components within normal limits  CBG MONITORING, ED - Abnormal; Notable for the following:    Glucose-Capillary 103 (*)    All other components within normal limits  CBC WITH DIFFERENTIAL/PLATELET  URINALYSIS, ROUTINE W REFLEX MICROSCOPIC    EKG  EKG Interpretation None       Radiology No results found.  Procedures Procedures (including critical care time)  Medications Ordered in ED Medications  sodium chloride 0.9 % bolus 1,000 mL (1,000 mLs Intravenous New Bag/Given 09/12/16 1603)     Initial Impression / Assessment and Plan / ED Course  I have reviewed the triage vital signs and the nursing notes.  Pertinent labs & imaging results that were available during my care of the patient were reviewed by me and considered in my medical decision making (see chart for details).     Patient here for evaluation of headache that occurred earlier today, now resolved as well as generalized malaise. He is nontoxic appearing on examination with no focal neurologic deficits. Presentation is not consistent with CVA, subarachnoid hemorrhage, meningitis. No evidence of DKA or significant lateral abnormality based on his CMP. Discussed patient importance of checking his sugars regularly at home as well as regular meals. Discussed outpatient follow-up and return precautions.  Final Clinical Impressions(s) / ED Diagnoses   Final diagnoses:  Asthenia  Bad headache    New Prescriptions New Prescriptions   No medications on file     Tilden Fossaees, Solaris Kram, MD 09/12/16 1644

## 2016-09-12 NOTE — ED Triage Notes (Signed)
Pt c/o h/a and dizziness x 1 day

## 2016-10-16 ENCOUNTER — Encounter (HOSPITAL_BASED_OUTPATIENT_CLINIC_OR_DEPARTMENT_OTHER): Payer: Self-pay | Admitting: Emergency Medicine

## 2016-10-16 ENCOUNTER — Emergency Department (HOSPITAL_BASED_OUTPATIENT_CLINIC_OR_DEPARTMENT_OTHER)
Admission: EM | Admit: 2016-10-16 | Discharge: 2016-10-16 | Disposition: A | Discharge status: Home / Self Care | Payer: 59 | Attending: Emergency Medicine | Admitting: Emergency Medicine

## 2016-10-16 DIAGNOSIS — F17200 Nicotine dependence, unspecified, uncomplicated: Secondary | ICD-10-CM | POA: Insufficient documentation

## 2016-10-16 DIAGNOSIS — E1165 Type 2 diabetes mellitus with hyperglycemia: Secondary | ICD-10-CM | POA: Insufficient documentation

## 2016-10-16 DIAGNOSIS — R739 Hyperglycemia, unspecified: Secondary | ICD-10-CM

## 2016-10-16 DIAGNOSIS — Z794 Long term (current) use of insulin: Secondary | ICD-10-CM | POA: Insufficient documentation

## 2016-10-16 LAB — I-STAT VENOUS BLOOD GAS, ED
ACID-BASE EXCESS: 4 mmol/L — AB (ref 0.0–2.0)
Bicarbonate: 30.9 mmol/L — ABNORMAL HIGH (ref 20.0–28.0)
O2 SAT: 60 %
PH VEN: 7.359 (ref 7.250–7.430)
Patient temperature: 98.1
TCO2: 33 mmol/L (ref 0–100)
pCO2, Ven: 54.7 mmHg (ref 44.0–60.0)
pO2, Ven: 33 mmHg (ref 32.0–45.0)

## 2016-10-16 LAB — CBC WITH DIFFERENTIAL/PLATELET
BASOS PCT: 0 %
Basophils Absolute: 0 10*3/uL (ref 0.0–0.1)
EOS ABS: 0.1 10*3/uL (ref 0.0–0.7)
Eosinophils Relative: 2 %
HCT: 44.4 % (ref 39.0–52.0)
HEMOGLOBIN: 15.6 g/dL (ref 13.0–17.0)
Lymphocytes Relative: 41 %
Lymphs Abs: 1.6 10*3/uL (ref 0.7–4.0)
MCH: 28.7 pg (ref 26.0–34.0)
MCHC: 35.1 g/dL (ref 30.0–36.0)
MCV: 81.8 fL (ref 78.0–100.0)
MONO ABS: 0.3 10*3/uL (ref 0.1–1.0)
MONOS PCT: 7 %
NEUTROS PCT: 50 %
Neutro Abs: 1.9 10*3/uL (ref 1.7–7.7)
PLATELETS: 174 10*3/uL (ref 150–400)
RBC: 5.43 MIL/uL (ref 4.22–5.81)
RDW: 12.2 % (ref 11.5–15.5)
WBC: 4 10*3/uL (ref 4.0–10.5)

## 2016-10-16 LAB — URINALYSIS, MICROSCOPIC (REFLEX)

## 2016-10-16 LAB — BASIC METABOLIC PANEL
Anion gap: 8 (ref 5–15)
BUN: 15 mg/dL (ref 6–20)
CALCIUM: 8.8 mg/dL — AB (ref 8.9–10.3)
CO2: 30 mmol/L (ref 22–32)
CREATININE: 1.08 mg/dL (ref 0.61–1.24)
Chloride: 96 mmol/L — ABNORMAL LOW (ref 101–111)
Glucose, Bld: 328 mg/dL — ABNORMAL HIGH (ref 65–99)
Potassium: 4.4 mmol/L (ref 3.5–5.1)
SODIUM: 134 mmol/L — AB (ref 135–145)

## 2016-10-16 LAB — URINALYSIS, ROUTINE W REFLEX MICROSCOPIC
BILIRUBIN URINE: NEGATIVE
Glucose, UA: >=500 mg/dL — AB
HGB URINE DIPSTICK: NEGATIVE
Ketones, ur: NEGATIVE mg/dL
Leukocytes, UA: NEGATIVE
Nitrite: NEGATIVE
PROTEIN: NEGATIVE mg/dL
SPECIFIC GRAVITY, URINE: 1.039 — AB (ref 1.005–1.030)
pH: 6.5 (ref 5.0–8.0)

## 2016-10-16 LAB — CBG MONITORING, ED
Glucose-Capillary: 303 mg/dL — ABNORMAL HIGH (ref 65–99)
Glucose-Capillary: 192 mg/dL — ABNORMAL HIGH (ref 65–99)

## 2016-10-16 MED ORDER — SODIUM CHLORIDE 0.9 % IV BOLUS (SEPSIS)
1000.0000 mL | Freq: Once | INTRAVENOUS | Status: AC
  Administered 2016-10-16: 1000 mL via INTRAVENOUS

## 2016-10-16 MED ORDER — BASAGLAR KWIKPEN 100 UNIT/ML ~~LOC~~ SOPN: 15.0000 [IU] | PEN_INJECTOR | Freq: Every day | SUBCUTANEOUS | 0 refills | Status: DC

## 2016-10-16 MED ORDER — INSULIN ASPART 100 UNIT/ML ~~LOC~~ SOLN: 4.0000 [IU] | Freq: Three times a day (TID) | SUBCUTANEOUS | 0 refills | Status: DC

## 2016-10-16 MED ORDER — INSULIN GLARGINE 100 UNIT/ML ~~LOC~~ SOLN
6.0000 [IU] | Freq: Once | SUBCUTANEOUS | Status: AC
  Administered 2016-10-16: 6 [IU] via SUBCUTANEOUS
  Filled 2016-10-16: qty 6

## 2016-10-16 NOTE — ED Notes (Signed)
ED Provider at bedside. 

## 2016-10-16 NOTE — ED Notes (Signed)
Pt verbalizes understanding of d/c instructions and denies any further needs at this time. 

## 2016-10-16 NOTE — Care Management (Signed)
ED CM received consult from Glenn SitesLisa Sanders PA-C concerning establishing follow up care and medication assistance. Patient presented to [facility]  with c/o hyperglycemia, patient is an insulin dependent diabetic,who was recently dropped from parents insurance when he turned 26 yo, patient ran out and has not taken insulin in 2 weeks.  [facility]  [facility], based on the guidelines.   ED CM enrolled patient, letter printed, and at patient request letter  faxed to [facility] on [address] in [place]. Patient will take prescriptions there once discharged.  Discussed establishing follow up care with EDP. Appointment scheduled for 8/8 at 9am the the [facility] information placed on AVS. No further ED CM needs

## 2016-10-16 NOTE — ED Notes (Signed)
Pt. Reports he felt his sugar was beginning to run high today and he was out of his insulin as of today with no script for refills.

## 2016-10-16 NOTE — ED Provider Notes (Signed)
[facility]   CSN: [id number] Arrival date & time: 10/16/16  1709     History   Chief Complaint Chief Complaint  Patient presents with  . Hyperglycemia    HPI Glenn Ball is a 26 y.o. male.  The history is provided by the patient and medical records.    26 year old male with history of depression and type 1 diabetes, presenting to the ED with hyperglycemia. Patient states [place] few months ago. States in April he came off of his [employer] insurance and is now uninsured. States he no longer can afford his insulin, he ran out about 2 weeks ago. States he has been borrowing from his mother and other friends that he notes her diabetic, but his blood sugar remains elevated. States he has had some increased thirst but denies any urinary frequency.  He denied any nausea or vomiting. No dizziness or weakness. No fever or other infectious symptoms. States he does not currently have a primary care doctor.  States he ran out of his insulin faster than before this past time, does admit he has not been eating well but states "I eat what I can afford".  Past Medical History:  Diagnosis Date  . Depression   . Diabetes mellitus     Patient Active Problem List   Diagnosis Date Noted  . Dehydration 10/14/2011  . Dizziness 10/14/2011  . Weight loss 10/14/2011  . Diabetes mellitus, new onset (HCC) 10/13/2011  . Hyperglycemia 10/13/2011    History reviewed. No pertinent surgical history.     Home Medications    Prior to Admission medications   Medication Sig Start Date End Date Taking? Authorizing Provider  insulin aspart (NOVOLOG) 100 UNIT/ML injection Inject 4 Units into the skin 3 (three) times daily with meals. 3 ml x 2 pens. 10 ml x2 vials Patient preference. 10/15/11 10/14/12  Cristal Fordeddy, Srikar A, MD  insulin glargine (LANTUS) 100 UNIT/ML injection Inject 15 Units into the skin at bedtime. 3 ml  x 2 pens. 10 ml x2 vials Patient preference. 10/15/11 10/14/12  Cristal Fordeddy, Srikar A, MD  UNABLE TO FIND Med Name: 120 syringes and needles for insulin 10/15/11   Cristal Fordeddy, Srikar A, MD  UNABLE TO FIND Please excuse Glenn Ball from any work or school obligations from 10/13/2011 till 10/29/2011 as he was hospitalized. 10/15/11   Cristal Fordeddy, Srikar A, MD    Family History History reviewed. No pertinent family history.  Social History Social History  Substance Use Topics  . Smoking status: Current Some Day Smoker  . Smokeless tobacco: Never Used  . Alcohol use No     Allergies   Sulfa antibiotics   Review of Systems Review of Systems  Endocrine:       Hyperglycemia  All other systems reviewed and are negative.    Physical Exam Updated Vital Signs BP 112/72 (BP Location: Right Arm)   Pulse (!) 57   Temp 98.1 F (36.7 C) (Oral)   Resp 16   Ht 5\' 9"  (1.753 m)   Wt 63.5 kg (140 lb)   SpO2 100%   BMI 20.67 kg/m   Physical Exam  Constitutional: He is oriented to person, place, and time. He appears well-developed and well-nourished.  HENT:  Head: Normocephalic and atraumatic.  Mouth/Throat: Oropharynx is clear and moist.  Mildly dry mucous membranes  Eyes: Pupils are equal, round, and reactive to light. Conjunctivae and EOM are normal.  Neck:  Normal range of motion.  Cardiovascular: Normal rate, regular rhythm and normal heart sounds.   Pulmonary/Chest: Effort normal and breath sounds normal.  Abdominal: Soft. Bowel sounds are normal.  Musculoskeletal: Normal range of motion.  Neurological: He is alert and oriented to person, place, and time.  Skin: Skin is warm and dry.  Psychiatric: He has a normal mood and affect.  Nursing note and vitals reviewed.    ED Treatments / Results  Labs (all labs ordered are listed, but only abnormal results are displayed) Labs Reviewed  BASIC METABOLIC PANEL - Abnormal; Notable for the following:       Result Value   Sodium 134 (*)     Chloride 96 (*)    Glucose, Bld 328 (*)    Calcium 8.8 (*)    All other components within normal limits  URINALYSIS, ROUTINE W REFLEX MICROSCOPIC - Abnormal; Notable for the following:    Specific Gravity, Urine 1.039 (*)    Glucose, UA >=500 (*)    All other components within normal limits  URINALYSIS, MICROSCOPIC (REFLEX) - Abnormal; Notable for the following:    Bacteria, UA RARE (*)    Squamous Epithelial / LPF 0-5 (*)    All other components within normal limits  CBG MONITORING, ED - Abnormal; Notable for the following:    Glucose-Capillary 303 (*)    All other components within normal limits  I-STAT VENOUS BLOOD GAS, ED - Abnormal; Notable for the following:    Bicarbonate 30.9 (*)    Acid-Base Excess 4.0 (*)    All other components within normal limits  CBG MONITORING, ED - Abnormal; Notable for the following:    Glucose-Capillary 192 (*)    All other components within normal limits  CBC WITH DIFFERENTIAL/PLATELET    EKG  EKG Interpretation None       Radiology No results found.  Procedures Procedures (including critical care time)  Medications Ordered in ED Medications - No data to display   Initial Impression / Assessment and Plan / ED Course  I have reviewed the triage vital signs and the nursing notes.  Pertinent labs & imaging results that were available during my care of the patient were reviewed by me and considered in my medical decision making (see chart for details).  26 year old male here with hyperglycemia. He is a type I diabetic, has been off his medications for about 2 weeks. Has been borrowing from his mother and friends. Here he is afebrile and nontoxic. Exam is overall benign, mucous membranes are mildly dry. Labs overall reassuring. CBG is elevated at 328 but anion gap, bicarbonate, and pH remain normal. UA without ketones. Not clinically consistent with DKA. Patient was hydrated here, given bolus of insulin with improvement of glucose.   Patient currently uninsured, working part-time job, and cannot afford his medications. I discussed with case management-- they have enrolled him in match program for free medications. His Lantus did have to be switched to basaglar.  Per Pharmacist, dosing is the same as lantus.  Have also arranged follow-up for him at wellness clinic for 10/25/16 for ongoing care.  Patient appreciative of this.  Will have him follow-up then.  He understands to return here if any new/acute changes develop.  Patient discharged home in stable condition.  Final Clinical Impressions(s) / ED Diagnoses   Final diagnoses:  Hyperglycemia    New Prescriptions Discharge Medication List as of 10/16/2016  8:31 PM    START taking these medications   Details  Insulin Glargine (BASAGLAR KWIKPEN) 100 UNIT/ML SOPN Inject 0.15 mLs (15 Units total) into the skin at bedtime., Starting Mon 10/16/2016, Print         Garlon Hatchet, PA-C 10/16/16 2134    Vanetta Mulders, MD 10/21/16 [phone]

## 2016-10-16 NOTE — ED Triage Notes (Signed)
Patient states that he is having tingling to his bilateral arms. Reports that he feels like his blood sugar is high and he is out of his medications. He is unable to get his medications or check his sugar

## 2016-10-16 NOTE — Discharge Instructions (Signed)
Take the prescribed medication as directed.  His insulin should be taken the same way as sure old insulins. Follow-up with the clinic on 10/25/2016. Your appointment is a 40 5 AM. If you need to reschedule this, please call. If you no show to this appointment, clinic can refuse to see you in the future. Return to the ED for new or worsening symptoms.

## 2016-10-25 ENCOUNTER — Ambulatory Visit (INDEPENDENT_AMBULATORY_CARE_PROVIDER_SITE_OTHER): Payer: Self-pay | Admitting: Family Medicine

## 2016-10-25 ENCOUNTER — Encounter: Payer: Self-pay | Admitting: Family Medicine

## 2016-10-25 VITALS — BP 123/68 | HR 55 | Temp 97.9°F | Resp 14 | Ht 69.0 in | Wt 148.0 lb

## 2016-10-25 DIAGNOSIS — E109 Type 1 diabetes mellitus without complications: Secondary | ICD-10-CM

## 2016-10-25 DIAGNOSIS — Z23 Encounter for immunization: Secondary | ICD-10-CM

## 2016-10-25 DIAGNOSIS — Z202 Contact with and (suspected) exposure to infections with a predominantly sexual mode of transmission: Secondary | ICD-10-CM

## 2016-10-25 LAB — GLUCOSE, CAPILLARY: GLUCOSE-CAPILLARY: 368 mg/dL — AB (ref 65–99)

## 2016-10-25 LAB — POCT URINALYSIS DIP (DEVICE)
BILIRUBIN URINE: NEGATIVE
GLUCOSE, UA: 500 mg/dL — AB
KETONES UR: NEGATIVE mg/dL
LEUKOCYTES UA: NEGATIVE
NITRITE: NEGATIVE
PH: 6.5 (ref 5.0–8.0)
Protein, ur: NEGATIVE mg/dL
Specific Gravity, Urine: 1.015 (ref 1.005–1.030)
Urobilinogen, UA: 0.2 mg/dL (ref 0.0–1.0)

## 2016-10-25 LAB — POCT GLYCOSYLATED HEMOGLOBIN (HGB A1C): HEMOGLOBIN A1C: 9.4

## 2016-10-25 MED ORDER — TRUE METRIX AIR GLUCOSE METER W/DEVICE KIT: 1.0000 | PACK | Freq: Three times a day (TID) | 0 refills | Status: DC

## 2016-10-25 MED ORDER — LANCET DEVICE MISC: 1.0000 | Freq: Three times a day (TID) | 99 refills | Status: DC

## 2016-10-25 MED ORDER — GLUCOSE BLOOD VI STRP: ORAL_STRIP | 99 refills | Status: DC

## 2016-10-25 MED ORDER — INSULIN ASPART 100 UNIT/ML ~~LOC~~ SOLN: 0.0000 [IU] | Freq: Three times a day (TID) | SUBCUTANEOUS | 0 refills | Status: DC

## 2016-10-25 MED ORDER — INSULIN GLARGINE 100 UNIT/ML SOLOSTAR PEN: 20.0000 [IU] | PEN_INJECTOR | Freq: Every day | SUBCUTANEOUS | 99 refills | Status: DC

## 2016-10-25 MED ORDER — BASAGLAR KWIKPEN 100 UNIT/ML ~~LOC~~ SOPN: 20.0000 [IU] | PEN_INJECTOR | Freq: Every day | SUBCUTANEOUS | 0 refills | Status: DC

## 2016-10-25 MED FILL — !TRUE METRIX BLOOD GLUCOSE: 365 days supply | Qty: 1 | Fill #0

## 2016-10-25 MED FILL — LANTUS SOLOSTAR 100 UNITS/M: 100 | 30 days supply | Qty: 6 | Fill #0

## 2016-10-25 MED FILL — TRUE METRIX TEST STRIP: 30 days supply | Qty: 100 | Fill #0

## 2016-10-25 MED FILL — !NOVOLOG 100UNITS/ML VIAL: 100/ML | 22 days supply | Qty: 10 | Fill #0

## 2016-10-25 MED FILL — TRUEplus LANCETS 28G MISC: 25 days supply | Qty: 100 | Fill #0

## 2016-10-25 NOTE — Patient Instructions (Addendum)
Will increase Basoglar to 20 units at bedtime Will start Novolog per sliding scale Check blood sugars 3 times per day prior to meals and at bedtime Will follow up by phone with any abnormal laboratory values.     Diabetes Mellitus and Skin Care Diabetes (diabetes mellitus) can lead to health problems over time, including skin problems. People with diabetes have a higher risk for many types of skin complications. This is because having poorly controlled blood sugar (glucose) levels can:  Damage nerves and blood vessels. This can result in decreased feeling in your legs and feet, which means you may not notice minor skin injuries that could lead to serious problems.  Reduce blood flow (circulation), which makes wounds heal more slowly and increases your risk of infection.  Cause areas of skin to become thick or discolored.  What are some common skin conditions that affect people with diabetes? Diabetes often causes dry skin. It can also cause the skin on the feet to get thinner, break more easily, and heal more slowly. There are certain skin conditions that commonly affect people who have diabetes, such as:  Bacterial skin infections, such as styes, boils, infected hair follicles, and infections of the skin around the nails.  Fungal skin infections. These are most common in areas where skin rubs together, such as in the armpits or under the breasts.  Open sores, especially on the feet.  Tissue death (gangrene). This can happen on your feet if a serious infection does not heal properly. Gangrene can cause the need for a foot or leg to be surgically removed (amputated).  Diabetes can also cause the skin to change. You may develop:  Dark, velvety markings on the skin that usually appear on the face, neck, armpits, inner thighs, and groin (acanthosis nigricans). This typically affects people of African-American and American-Indian descent.  Red, raised, scar-like tissue that may itch, feel  painful, or develop into a wound (necrobiosis lipoidica).  Blisters on feet, toes, hands, or fingers.  Thickened, wax-like areas of skin that usually occur on the hands, forehead, or toes (digital sclerosis).  Brown or red ring-shaped or half-ring-shaped patches of skin on the ears or fingers (disseminated granuloma).  Pea-shaped yellow bumps that may be itchy and surrounded by a red ring (eruptive xanthomatosis). This usually affects the arms, feet, buttocks, and the top of the hands.  Round, discolored patches of tan skin that do not hurt or itch (diabetic dermopathy). These may look like age spots.  What do I need to know about itchy skin? It is common for people with diabetes to have itchy skin caused by dryness. Frequent high blood glucose levels can cause itchiness, and poor circulation and certain skin infections can make dry, itchy skin worse. If you have itchy skin that is red or covered in a rash, this could be a sign of an allergic reaction to a medicine. If you have a rash or if your skin is very itchy, contact your health care provider. You may need help to manage your diabetes better, or you may need treatment for an infection. How can I prevent skin breakdown? When you have diabetes and you get a badly infected ulcer or sore that does not heal, your skin can break down, especially if you have poor circulation or are on bed rest. To prevent skin breakdown:  Keep your skin clean and dry. Wash your skin often. Do not use hot water.  Do not use any products that contain nicotine or tobacco,  such as cigarettes and e-cigarettes. Smoking affects the body's ability to heal. If you need help quitting, ask your health care provider.  Check your skin every day for cuts, bruises, redness, blisters, or sores, especially on your feet. Tell your health care provider about any cuts, wounds, or sores you have, especially if they are healing slowly.  If you are on bed rest, try to change  positions often.  What else do I need to know about taking care of my skin?   To relieve dry skin and itching: ? Limit baths and showers to 5-10 minutes. ? Bathe with lukewarm water instead of hot water. ? Use mild soap and gentle skin cleansers. Do not use soap that is perfumed or harsh or dries your skin. ? Put on lotion as soon as you finish bathing.  Make sure that your health care provider performs a visual foot exam at every medical visit.  Schedule a foot exam with your health care provider once every year. This exam includes an inspection of the structure and skin of your feet.  If you get a skin injury, such as a cut, blister, or sore, check the area every day for signs of infection. Check for: ? More redness, swelling, or pain. ? More fluid or blood. ? Warmth. ? Pus or a bad smell. Contact a health care provider if:  You develop a cut or sore, especially on your feet.  You develop signs of infection after a skin injury.  Your blood glucose level is higher than 240 mg/dL (13.3 mmol/L) for 2 days in a row.  You have itchy skin that develops redness or a rash.  You have discolored areas of skin.  You have areas where your skin is changing, such as thickening or appearing shiny. This information is not intended to replace advice given to you by your health care provider. Make sure you discuss any questions you have with your health care provider. Document Released: 08/17/2015 Document Revised: 09/24/2015 Document Reviewed: 08/17/2015 Elsevier Interactive Patient Education  2018 [employer].  Diabetes Mellitus and Exercise Exercising regularly is important for your overall health, especially when you have diabetes (diabetes mellitus). Exercising is not only about losing weight. It has many health benefits, such as increasing muscle strength and bone density and reducing body fat and stress. This leads to improved fitness, flexibility, and endurance, all of which result in  better overall health. Exercise has additional benefits for people with diabetes, including:  Reducing appetite.  Helping to lower and control blood glucose.  Lowering blood pressure.  Helping to control amounts of fatty substances (lipids) in the blood, such as cholesterol and triglycerides.  Helping the body to respond better to insulin (improving insulin sensitivity).  Reducing how much insulin the body needs.  Decreasing the risk for heart disease by: ? Lowering cholesterol and triglyceride levels. ? Increasing the levels of good cholesterol. ? Lowering blood glucose levels.  What is my activity plan? Your health care provider or certified diabetes educator can help you make a plan for the type and frequency of exercise (activity plan) that works for you. Make sure that you:  Do at least 150 minutes of moderate-intensity or vigorous-intensity exercise each week. This could be brisk walking, biking, or water aerobics. ? Do stretching and strength exercises, such as yoga or weightlifting, at least 2 times a week. ? Spread out your activity over at least 3 days of the week.  Get some form of physical activity every day. ?  Do not go more than 2 days in a row without some kind of physical activity. ? Avoid being inactive for more than 90 minutes at a time. Take frequent breaks to walk or stretch.  Choose a type of exercise or activity that you enjoy, and set realistic goals.  Start slowly, and gradually increase the intensity of your exercise over time.  What do I need to know about managing my diabetes?  Check your blood glucose before and after exercising. ? If your blood glucose is higher than 240 mg/dL (13.3 mmol/L) before you exercise, check your urine for ketones. If you have ketones in your urine, do not exercise until your blood glucose returns to normal.  Know the symptoms of low blood glucose (hypoglycemia) and how to treat it. Your risk for hypoglycemia increases  during and after exercise. Common symptoms of hypoglycemia can include: ? Hunger. ? Anxiety. ? Sweating and feeling clammy. ? Confusion. ? Dizziness or feeling light-headed. ? Increased heart rate or palpitations. ? Blurry vision. ? Tingling or numbness around the mouth, lips, or tongue. ? Tremors or shakes. ? Irritability.  Keep a rapid-acting carbohydrate snack available before, during, and after exercise to help prevent or treat hypoglycemia.  Avoid injecting insulin into areas of the body that are going to be exercised. For example, avoid injecting insulin into: ? The arms, when playing tennis. ? The legs, when jogging.  Keep records of your exercise habits. Doing this can help you and your health care provider adjust your diabetes management plan as needed. Write down: ? Food that you eat before and after you exercise. ? Blood glucose levels before and after you exercise. ? The type and amount of exercise you have done. ? When your insulin is expected to peak, if you use insulin. Avoid exercising at times when your insulin is peaking.  When you start a new exercise or activity, work with your health care provider to make sure the activity is safe for you, and to adjust your insulin, medicines, or food intake as needed.  Drink plenty of water while you exercise to prevent dehydration or heat stroke. Drink enough fluid to keep your urine clear or pale yellow. This information is not intended to replace advice given to you by your health care provider. Make sure you discuss any questions you have with your health care provider. Document Released: 05/27/2003 Document Revised: 09/24/2015 Document Reviewed: 08/16/2015 Elsevier Interactive Patient Education  2018 [employer].  Hyperglycemia Hyperglycemia is when the sugar (glucose) level in your blood is too high. It may not cause symptoms. If you do have symptoms, they may include warning signs, such as:  Feeling more thirsty than  normal.  Hunger.  Feeling tired.  Needing to pee (urinate) more than normal.  Blurry eyesight (vision).  You may get other symptoms as it gets worse, such as:  Dry mouth.  Not being hungry (loss of appetite).  Fruity-smelling breath.  Weakness.  Weight gain or loss that is not planned. Weight loss may be fast.  A tingling or numb feeling in your hands or feet.  Headache.  Skin that does not bounce back quickly when it is lightly pinched and released (poor skin turgor).  Pain in your belly (abdomen).  Cuts or bruises that heal slowly.  High blood sugar can happen to people who do or do not have diabetes. High blood sugar can happen slowly or quickly, and it can be an emergency. Follow these instructions at home: General instructions  Take over-the-counter and prescription medicines only as told by your doctor.  Do not use products that contain nicotine or tobacco, such as cigarettes and e-cigarettes. If you need help quitting, ask your doctor.  Limit alcohol intake to no more than 1 drink per day for nonpregnant women and 2 drinks per day for men. One drink equals 12 oz of beer, 5 oz of wine, or 1 oz of hard liquor.  Manage stress. If you need help with this, ask your doctor.  Keep all follow-up visits as told by your doctor. This is important. Eating and drinking  Stay at a healthy weight.  Exercise regularly, as told by your doctor.  Drink enough fluid, especially when you: ? Exercise. ? Get sick. ? Are in hot temperatures.  Eat healthy foods, such as: ? Low-fat (lean) proteins. ? Complex carbs (complex carbohydrates), such as whole wheat bread or brown rice. ? Fresh fruits and vegetables. ? Low-fat dairy products. ? Healthy fats.  Drink enough fluid to keep your pee (urine) clear or pale yellow. If you have diabetes:  Make sure you know the symptoms of hyperglycemia.  Follow your diabetes management plan, as told by your doctor. Make sure  you: ? Take insulin and medicines as told. ? Follow your exercise plan. ? Follow your meal plan. Eat on time. Do not skip meals. ? Check your blood sugar as often as told. Make sure to check before and after exercise. If you exercise longer or in a different way than you normally do, check your blood sugar more often. ? Follow your sick day plan whenever you cannot eat or drink normally. Make this plan ahead of time with your doctor.  Share your diabetes management plan with people in your workplace, school, and household.  Check your urine for ketones when you are ill and as told by your doctor.  Carry a card or wear jewelry that says that you have diabetes. Contact a doctor if:  Your blood sugar level is higher than 240 mg/dL (13.3 mmol/L) for 2 days in a row.  You have problems keeping your blood sugar in your target range.  High blood sugar happens often for you. Get help right away if:  You have trouble breathing.  You have a change in how you think, feel, or act (mental status).  You feel sick to your stomach (nauseous), and that feeling does not go away.  You cannot stop throwing up (vomiting). These symptoms may be an emergency. Do not wait to see if the symptoms will go away. Get medical help right away. Call your local emergency services (911 in the [place] Do not drive yourself to the hospital. Summary  Hyperglycemia is when the sugar (glucose) level in your blood is too high.  High blood sugar can happen to people who do or do not have diabetes.  Make sure you drink enough fluids, eat healthy foods, and exercise regularly.  Contact your doctor if you have problems keeping your blood sugar in your target range. This information is not intended to replace advice given to you by your health care provider. Make sure you discuss any questions you have with your health care provider. Document Released: 01/01/2009 Document Revised: 11/22/2015 Document Reviewed:  11/22/2015 Elsevier Interactive Patient Education  2017 [employer].  Hypoglycemia Hypoglycemia occurs when the level of sugar (glucose) in the blood is too low. Glucose is a type of sugar that provides the body's main source of energy. Certain hormones (insulin and glucagon)  control the level of glucose in the blood. Insulin lowers blood glucose, and glucagon increases blood glucose. Hypoglycemia can result from having too much insulin in the bloodstream, or from not eating enough food that contains glucose. Hypoglycemia can happen in people who do or do not have diabetes. It can develop quickly, and it can be a medical emergency. What are the causes? Hypoglycemia occurs most often in people who have diabetes. If you have diabetes, hypoglycemia may be caused by:  Diabetes medicine.  Not eating enough, or not eating often enough.  Increased physical activity.  Drinking alcohol, especially when you have not eaten recently.  If you do not have diabetes, hypoglycemia may be caused by:  A tumor in the pancreas. The pancreas is the organ that makes insulin.  Not eating enough, or not eating for long periods at a time (fasting).  Severe infection or illness that affects the liver, heart, or kidneys.  Certain medicines.  You may also have reactive hypoglycemia. This condition causes hypoglycemia within 4 hours of eating a meal. This may occur after having stomach surgery. Sometimes, the cause of reactive hypoglycemia is not known. What increases the risk? Hypoglycemia is more likely to develop in:  People who have diabetes and take medicines to lower blood glucose.  People who abuse alcohol.  People who have a severe illness.  What are the signs or symptoms? Hypoglycemia may not cause any symptoms. If you have symptoms, they may include:  Hunger.  Anxiety.  Sweating and feeling clammy.  Confusion.  Dizziness or feeling light-headed.  Sleepiness.  Nausea.  Increased  heart rate.  Headache.  Blurry vision.  Seizure.  Nightmares.  Tingling or numbness around the mouth, lips, or tongue.  A change in speech.  Decreased ability to concentrate.  A change in coordination.  Restless sleep.  Tremors or shakes.  Fainting.  Irritability.  How is this diagnosed? Hypoglycemia is diagnosed with a blood test to measure your blood glucose level. This blood test is done while you are having symptoms. Your health care provider may also do a physical exam and review your medical history. If you do not have diabetes, other tests may be done to find the cause of your hypoglycemia. How is this treated? This condition can often be treated by immediately eating or drinking something that contains glucose, such as:  3-4 sugar tablets (glucose pills).  Glucose gel, 15-gram tube.  Fruit juice, 4 oz (120 mL).  Regular soda (not diet soda), 4 oz (120 mL).  Low-fat milk, 4 oz (120 mL).  Several pieces of hard candy.  Sugar or honey, 1 Tbsp.  Treating Hypoglycemia If You Have Diabetes  If you are alert and able to swallow safely, follow the 15:15 rule:  Take 15 grams of a rapid-acting carbohydrate. Rapid-acting options include: ? 1 tube of glucose gel. ? 3 glucose pills. ? 6-8 pieces of hard candy. ? 4 oz (120 mL) of fruit juice. ? 4 oz (120 ml) of regular (not diet) soda.  Check your blood glucose 15 minutes after you take the carbohydrate.  If the repeat blood glucose level is still at or below 70 mg/dL (3.9 mmol/L), take 15 grams of a carbohydrate again.  If your blood glucose level does not increase above 70 mg/dL (3.9 mmol/L) after 3 tries, seek emergency medical care.  After your blood glucose level returns to normal, eat a meal or a snack within 1 hour.  Treating Severe Hypoglycemia Severe hypoglycemia is when your  blood glucose level is at or below 54 mg/dL (3 mmol/L). Severe hypoglycemia is an emergency. Do not wait to see if the  symptoms will go away. Get medical help right away. Call your local emergency services (911 in the [place] Do not drive yourself to the hospital. If you have severe hypoglycemia and you cannot eat or drink, you may need an injection of glucagon. A family member or close friend should learn how to check your blood glucose and how to give you a glucagon injection. Ask your health care provider if you need to have an emergency glucagon injection kit available. Severe hypoglycemia may need to be treated in a hospital. The treatment may include getting glucose through an IV tube. You may also need treatment for the cause of your hypoglycemia. Follow these instructions at home: General instructions  Avoid any diets that cause you to not eat enough food. Talk with your health care provider before you start any new diet.  Take over-the-counter and prescription medicines only as told by your health care provider.  Limit alcohol intake to no more than 1 drink per day for nonpregnant women and 2 drinks per day for men. One drink equals 12 oz of beer, 5 oz of wine, or 1 oz of hard liquor.  Keep all follow-up visits as told by your health care provider. This is important. If You Have Diabetes:   Make sure you know the symptoms of hypoglycemia.  Always have a rapid-acting carbohydrate snack with you to treat low blood sugar.  Follow your diabetes management plan, as told by your health care provider. Make sure you: ? Take your medicines as directed. ? Follow your exercise plan. ? Follow your meal plan. Eat on time, and do not skip meals. ? Check your blood glucose as often as directed. Make sure to check your blood glucose before and after exercise. If you exercise longer or in a different way than usual, check your blood glucose more often. ? Follow your sick day plan whenever you cannot eat or drink normally. Make this plan in advance with your health care provider.  Share your diabetes management  plan with people in your workplace, school, and household.  Check your urine for ketones when you are ill and as told by your health care provider.  Carry a medical alert card or wear medical alert jewelry. If You Have Reactive Hypoglycemia or Low Blood Sugar From Other Causes:  Monitor your blood glucose as told by your health care provider.  Follow instructions from your health care provider about eating or drinking restrictions. Contact a health care provider if:  You have problems keeping your blood glucose in your target range.  You have frequent episodes of hypoglycemia. Get help right away if:  You continue to have hypoglycemia symptoms after eating or drinking something containing glucose.  Your blood glucose is at or below 54 mg/dL (3 mmol/L).  You have a seizure.  You faint. These symptoms may represent a serious problem that is an emergency. Do not wait to see if the symptoms will go away. Get medical help right away. Call your local emergency services (911 in the [place] Do not drive yourself to the hospital. This information is not intended to replace advice given to you by your health care provider. Make sure you discuss any questions you have with your health care provider. Document Released: 03/06/2005 Document Revised: 08/18/2015 Document Reviewed: 04/09/2015 Elsevier Interactive Patient Education  2018 [employer].  Carbohydrate Counting  for Diabetes Mellitus, Adult Carbohydrate counting is a method for keeping track of how many carbohydrates you eat. Eating carbohydrates naturally increases the amount of sugar (glucose) in the blood. Counting how many carbohydrates you eat helps keep your blood glucose within normal limits, which helps you manage your diabetes (diabetes mellitus). It is important to know how many carbohydrates you can safely have in each meal. This is different for every person. A diet and nutrition specialist (registered dietitian) can help you  make a meal plan and calculate how many carbohydrates you should have at each meal and snack. Carbohydrates are found in the following foods:  Grains, such as breads and cereals.  Dried beans and soy products.  Starchy vegetables, such as potatoes, peas, and corn.  Fruit and fruit juices.  Milk and yogurt.  Sweets and snack foods, such as cake, cookies, candy, chips, and soft drinks.  How do I count carbohydrates? There are two ways to count carbohydrates in food. You can use either of the methods or a combination of both. Reading "Nutrition Facts" on packaged food The "Nutrition Facts" list is included on the labels of almost all packaged foods and beverages in the [place] It includes:  The serving size.  Information about nutrients in each serving, including the grams (g) of carbohydrate per serving.  To use the "Nutrition Facts":  Decide how many servings you will have.  Multiply the number of servings by the number of carbohydrates per serving.  The resulting number is the total amount of carbohydrates that you will be having.  Learning standard serving sizes of other foods When you eat foods containing carbohydrates that are not packaged or do not include "Nutrition Facts" on the label, you need to measure the servings in order to count the amount of carbohydrates:  Measure the foods that you will eat with a food scale or measuring cup, if needed.  Decide how many standard-size servings you will eat.  Multiply the number of servings by 15. Most carbohydrate-rich foods have about 15 g of carbohydrates per serving. ? For example, if you eat 8 oz (170 g) of strawberries, you will have eaten 2 servings and 30 g of carbohydrates (2 servings x 15 g = 30 g).  For foods that have more than one food mixed, such as soups and casseroles, you must count the carbohydrates in each food that is included.  The following list contains standard serving sizes of common carbohydrate-rich  foods. Each of these servings has about 15 g of carbohydrates:   hamburger bun or  English muffin.   oz (15 mL) syrup.   oz (14 g) jelly.  1 slice of bread.  1 six-inch tortilla.  3 oz (85 g) cooked rice or pasta.  4 oz (113 g) cooked dried beans.  4 oz (113 g) starchy vegetable, such as peas, corn, or potatoes.  4 oz (113 g) hot cereal.  4 oz (113 g) mashed potatoes or  of a large baked potato.  4 oz (113 g) canned or frozen fruit.  4 oz (120 mL) fruit juice.  4-6 crackers.  6 chicken nuggets.  6 oz (170 g) unsweetened dry cereal.  6 oz (170 g) plain fat-free yogurt or yogurt sweetened with artificial sweeteners.  8 oz (240 mL) milk.  8 oz (170 g) fresh fruit or one small piece of fruit.  24 oz (680 g) popped popcorn.  Example of carbohydrate counting Sample meal  3 oz (85 g) chicken breast.  6 oz (170 g) brown rice.  4 oz (113 g) corn.  8 oz (240 mL) milk.  8 oz (170 g) strawberries with sugar-free whipped topping. Carbohydrate calculation 1. Identify the foods that contain carbohydrates: ? Rice. ? Corn. ? Milk. ? Strawberries. 2. Calculate how many servings you have of each food: ? 2 servings rice. ? 1 serving corn. ? 1 serving milk. ? 1 serving strawberries. 3. Multiply each number of servings by 15 g: ? 2 servings rice x 15 g = 30 g. ? 1 serving corn x 15 g = 15 g. ? 1 serving milk x 15 g = 15 g. ? 1 serving strawberries x 15 g = 15 g. 4. Add together all of the amounts to find the total grams of carbohydrates eaten: ? 30 g + 15 g + 15 g + 15 g = 75 g of carbohydrates total. This information is not intended to replace advice given to you by your health care provider. Make sure you discuss any questions you have with your health care provider. Document Released: 03/06/2005 Document Revised: 09/24/2015 Document Reviewed: 08/18/2015 Elsevier Interactive Patient Education  [employer].

## 2016-10-25 NOTE — Progress Notes (Signed)
 Subjective:    Patient ID: Glenn Ball, male    DOB: 12/06/1990, 26 y.o.   MRN: [id number]  HPI Glenn Ball, a 26 year old male with a history of type 1 diabetes mellitus. He was a patient of Dr. Rafaela Aguiar, but has been lost to follow up due to insurance constraints. Patient was evaluated in the emergency department on 10/16/2016 due to hyperglycemia. Patient had been out of insulin prior to ER visit. He has been taking Lantus 20 units consistently. He says that he was diagnosed with type 1 diabetes at age 21. He has not been checking blood sugars at home.His clinical course has fluctuated. Associated symptoms of hyperglycemia have been excessive thirst and polyuria.   Patient denies foot ulcerations, hyperglycemia, hypoglycemia , nausea, paresthesia of the feet, visual disturbances, vomitting and weight loss.     Past Medical History:  Diagnosis Date  . Depression   . Diabetes mellitus    Social History   Social History  . Marital status: Single    Spouse name: N/A  . Number of children: N/A  . Years of education: N/A   Occupational History  . Not on file.   Social History Main Topics  . Smoking status: Former Smoker  . Smokeless tobacco: Never Used  . Alcohol use Yes     Comment: occ  . Drug use: Yes    Types: Marijuana     Comment: 1-2 times a month. occ  . Sexual activity: Yes   Other Topics Concern  . Not on file   Social History Narrative  . No narrative on file   Allergies  Allergen Reactions  . Sulfa Antibiotics     "just knows he's allergic"   Immunization History  Administered Date(s) Administered  . Pneumococcal Polysaccharide-23 10/25/2016  . Tdap 10/25/2016   Diabetic Foot Exam - Simple   Simple Foot Form Diabetic Foot exam was performed with the following findings:  Yes 10/25/2016  8:30 AM  Visual Inspection No deformities, no ulcerations, no other skin breakdown bilaterally:  Yes Sensation Testing Intact to touch and monofilament testing  bilaterally:  Yes Pulse Check Posterior Tibialis and Dorsalis pulse intact bilaterally:  Yes Comments     Review of Systems  Constitutional: Negative for fatigue.  HENT: Negative.   Eyes: Negative for photophobia.  Respiratory: Negative.   Cardiovascular: Negative for chest pain and leg swelling.  Gastrointestinal: Negative.   Endocrine: Positive for polydipsia, polyphagia and polyuria.  Genitourinary: Negative.   Musculoskeletal: Negative.   Skin: Negative.   Neurological: Negative.   Hematological: Negative.   Psychiatric/Behavioral: Negative.        Objective:   Physical Exam  Constitutional: He is oriented to person, place, and time.  HENT:  Head: Normocephalic and atraumatic.  Right Ear: External ear normal.  Left Ear: External ear normal.  Nose: Nose normal.  Mouth/Throat: Oropharynx is clear and moist.  Eyes: Pupils are equal, round, and reactive to light. Conjunctivae and EOM are normal.  Neck: Normal range of motion. Neck supple.  Cardiovascular: Normal rate, regular rhythm, normal heart sounds and intact distal pulses.   Pulmonary/Chest: Effort normal and breath sounds normal.  Abdominal: Soft. Bowel sounds are normal.  Musculoskeletal: Normal range of motion.  Neurological: He is alert and oriented to person, place, and time. He has normal reflexes.  Skin: Skin is warm and dry.  Psychiatric: He has a normal mood and affect. His behavior is normal. Judgment and thought content normal.        Assessment & Plan:  1. Type 1 diabetes mellitus without complication (HCC) Hemoglobin a1c is 9.4. Glenn Ball may benefit from sliding scale insulin at this juncture.  Will continue Lantus 20 units daily  - HgB A1c - Glucose, capillary - POCT urinalysis dip (device) - glucose blood (TRUE METRIX BLOOD GLUCOSE TEST) test strip; Check blood sugars 3 times per day prior to meals and at bedtime. Use as instructed  Dispense: 100 each; Refill: prn - Lancet Device MISC; 1 each by  Does not apply route 4 (four) times daily -  before meals and at bedtime.  Dispense: 100 each; Refill: prn - Blood Glucose Monitoring Suppl (TRUE METRIX AIR GLUCOSE METER) w/Device KIT; 1 each by Does not apply route 4 (four) times daily -  with meals and at bedtime.  Dispense: 1 kit; Refill: 0 - COMPLETE METABOLIC PANEL WITH GFR - Microalbumin/Creatinine Ratio, Urine - insulin aspart (NOVOLOG) 100 UNIT/ML injection; Inject 0-15 Units into the skin 3 (three) times daily before meals.  Dispense: 10 mL; Refill: 0 - Insulin Glargine (LANTUS SOLOSTAR) 100 UNIT/ML Solostar Pen; Inject 20 Units into the skin daily at 10 pm.  Dispense: 5 pen; Refill: PRN  2. Possible exposure to STD Recommend barrier protection with sexual intercourse - HIV antibody (with reflex) - RPR - GC/Chlamydia Probe Amp  3. Need for Tdap vaccination - Tdap vaccine greater than or equal to 7yo IM  4. Immunization due - Pneumococcal polysaccharide vaccine 23-valent greater than or equal to 2yo subcutaneous/IM   RTC: 1 month for type 1 diabetes mellitus. Patient advised to complete financial assistance forms   [name]  MSN, FNP-C [facility] [address]  [place], [place] [address] [phone]  

## 2016-10-26 LAB — COMPLETE METABOLIC PANEL WITH GFR
ALK PHOS: 89 U/L (ref 40–115)
ALT: 10 U/L (ref 9–46)
AST: 13 U/L (ref 10–40)
Albumin: 4.6 g/dL (ref 3.6–5.1)
BUN: 10 mg/dL (ref 7–25)
CALCIUM: 9.9 mg/dL (ref 8.6–10.3)
CHLORIDE: 97 mmol/L — AB (ref 98–110)
CO2: 23 mmol/L (ref 20–32)
Creat: 1.07 mg/dL (ref 0.60–1.35)
Glucose, Bld: 386 mg/dL — ABNORMAL HIGH (ref 65–99)
POTASSIUM: 5.4 mmol/L — AB (ref 3.5–5.3)
Sodium: 135 mmol/L (ref 135–146)
Total Bilirubin: 2.2 mg/dL — ABNORMAL HIGH (ref 0.2–1.2)
Total Protein: 7.4 g/dL (ref 6.1–8.1)

## 2016-10-26 LAB — MICROALBUMIN / CREATININE URINE RATIO
CREATININE, URINE: 119 mg/dL (ref 20–370)
MICROALB UR: 1.4 mg/dL
Microalb Creat Ratio: 12 mcg/mg creat (ref <30)

## 2016-10-26 LAB — HIV ANTIBODY (ROUTINE TESTING W REFLEX): HIV: NONREACTIVE

## 2016-10-26 LAB — GC/CHLAMYDIA PROBE AMP
CT Probe RNA: DETECTED — AB
GC Probe RNA: NOT DETECTED

## 2016-10-26 LAB — RPR

## 2016-10-27 ENCOUNTER — Other Ambulatory Visit: Payer: Self-pay | Admitting: Family Medicine

## 2016-10-27 DIAGNOSIS — A749 Chlamydial infection, unspecified: Secondary | ICD-10-CM

## 2016-10-27 MED ORDER — AZITHROMYCIN 500 MG PO TABS: 1000.0000 mg | ORAL_TABLET | Freq: Once | ORAL | 0 refills | Status: DC

## 2016-10-27 NOTE — Progress Notes (Signed)
Montrae Folds, a 26 year old male was screened for sexually transmitted infections on 10/25/2016. Positive for chlamydia trachomatous. Will treat with Azithromycin 1000 mg once. Recommend that patient refrain from sexual intercourse for 7 days following treatment.  Recommend barrier protection with sexual intercourse   Meds ordered this encounter  Medications  . azithromycin (ZITHROMAX) 500 MG tablet    Sig: Take 2 tablets (1,000 mg total) by mouth once.    Dispense:  2 tablet    Refill:  0     Nolon NationsLaChina Moore Paradise Vensel  MSN, FNP-C [facility] [address]  , [place] [id number] [phone]

## 2016-10-30 ENCOUNTER — Other Ambulatory Visit: Payer: Self-pay

## 2016-10-30 DIAGNOSIS — A749 Chlamydial infection, unspecified: Secondary | ICD-10-CM

## 2016-10-30 MED ORDER — AZITHROMYCIN 500 MG PO TABS: 1000.0000 mg | ORAL_TABLET | Freq: Once | ORAL | 0 refills | Status: AC

## 2016-10-30 NOTE — Progress Notes (Signed)
Called, no answer. Left a message for patient to return call. Thanks!  

## 2016-10-30 NOTE — Progress Notes (Signed)
Sent in azithromycin to [facility] in [place]. Thanks!

## 2016-10-30 NOTE — Progress Notes (Signed)
Patient returned call. I advised of positive for chlamydia and the need to take azithromycin 1000mg  once as a single dose to treat this. Advised that we should refrain from sexual intercourse for 7 days and to use barrier protection with sexual intercourse. Advised patient that he should let sex partner(s) know that they should be tested and that he may become re-infected if not. Patient verbalized understanding and I have sent in rx to corrected pharmacy. Thanks!

## 2016-11-17 ENCOUNTER — Other Ambulatory Visit: Payer: Self-pay

## 2016-11-17 DIAGNOSIS — E109 Type 1 diabetes mellitus without complications: Secondary | ICD-10-CM

## 2016-11-17 MED ORDER — INSULIN GLARGINE 100 UNIT/ML SOLOSTAR PEN
20.0000 [IU] | PEN_INJECTOR | Freq: Every day | SUBCUTANEOUS | 99 refills | Status: DC
Start: 2016-11-17 — End: 2016-11-27

## 2016-11-17 MED ORDER — INSULIN ASPART 100 UNIT/ML ~~LOC~~ SOLN: 0.0000 [IU] | Freq: Three times a day (TID) | SUBCUTANEOUS | 0 refills | Status: DC

## 2016-11-17 MED FILL — !NOVOLOG 100UNITS/ML VIAL: 100/ML | 22 days supply | Qty: 10 | Fill #0

## 2016-11-17 NOTE — Telephone Encounter (Signed)
[facility]

## 2016-11-24 MED FILL — !LANTUS SOLOSTAR 100UNITS/M: 100 | 30 days supply | Qty: 6 | Fill #0

## 2016-11-27 ENCOUNTER — Encounter: Payer: Self-pay | Admitting: Family Medicine

## 2016-11-27 ENCOUNTER — Ambulatory Visit (INDEPENDENT_AMBULATORY_CARE_PROVIDER_SITE_OTHER): Payer: Self-pay | Admitting: Family Medicine

## 2016-11-27 ENCOUNTER — Other Ambulatory Visit: Payer: Self-pay | Admitting: Family Medicine

## 2016-11-27 VITALS — BP 117/65 | HR 78 | Temp 98.2°F | Resp 14 | Ht 69.0 in | Wt 149.0 lb

## 2016-11-27 DIAGNOSIS — E109 Type 1 diabetes mellitus without complications: Secondary | ICD-10-CM

## 2016-11-27 DIAGNOSIS — E119 Type 2 diabetes mellitus without complications: Secondary | ICD-10-CM

## 2016-11-27 DIAGNOSIS — R300 Dysuria: Secondary | ICD-10-CM

## 2016-11-27 LAB — POCT GLYCOSYLATED HEMOGLOBIN (HGB A1C): HEMOGLOBIN A1C: 8

## 2016-11-27 LAB — BASIC METABOLIC PANEL WITH GFR
BUN: 18 mg/dL (ref 7–25)
CO2: 30 mmol/L (ref 20–32)
Calcium: 9.3 mg/dL (ref 8.6–10.3)
Chloride: 102 mmol/L (ref 98–110)
Creat: 0.96 mg/dL (ref 0.60–1.35)
GFR, EST NON AFRICAN AMERICAN: 109 mL/min/{1.73_m2} (ref >=60)
GFR, Est African American: 126 mL/min/{1.73_m2} (ref >=60)
Glucose, Bld: 117 mg/dL — ABNORMAL HIGH (ref 65–99)
POTASSIUM: 3.9 mmol/L (ref 3.5–5.3)
SODIUM: 140 mmol/L (ref 135–146)

## 2016-11-27 LAB — GLUCOSE, CAPILLARY: GLUCOSE-CAPILLARY: 117 mg/dL — AB (ref 65–99)

## 2016-11-27 MED ORDER — TRUE METRIX AIR GLUCOSE METER W/DEVICE KIT: 1.0000 | PACK | Freq: Three times a day (TID) | 0 refills | Status: AC

## 2016-11-27 MED ORDER — INSULIN GLARGINE 100 UNIT/ML SOLOSTAR PEN: 20.0000 [IU] | PEN_INJECTOR | Freq: Every day | SUBCUTANEOUS | 99 refills | Status: DC

## 2016-11-27 MED ORDER — INSULIN ASPART 100 UNIT/ML ~~LOC~~ SOLN: 0.0000 [IU] | Freq: Three times a day (TID) | SUBCUTANEOUS | 99 refills | Status: DC

## 2016-11-27 MED ORDER — TRUE METRIX AIR GLUCOSE METER W/DEVICE KIT: 1.0000 | PACK | Freq: Three times a day (TID) | 0 refills | Status: DC

## 2016-11-27 MED FILL — !TRUE METRIX BLOOD GLUCOSE: 30 days supply | Qty: 1 | Fill #0

## 2016-11-27 MED FILL — !NOVOLOG 100UNITS/ML VIAL: 100/ML | 20 days supply | Qty: 10 | Fill #0

## 2016-11-27 NOTE — Progress Notes (Signed)
Subjective:    Patient ID: Glenn Ball, male    DOB: 12/05/1990, 26 y.o.   MRN: [id number]  Glenn Ball, a 2He has been taking Lantus 20 units consistently. He says that he was diagnosed with type 1 diabetes at age 82. He has not been checking blood sugars at home.His clinical course has fluctuated. Associated symptoms of hyperglycemia have been excessive thirst and polyuria.   Patient denies foot ulcerations, hyperglycemia, hypoglycemia , nausea, paresthesia of the feet, visual disturbances, vomitting and weight loss.  26 year old male with a history of type 1 diabetes mellitus.   Diabetes  He has type 1 diabetes mellitus. His disease course has been stable. Pertinent negatives for diabetes include no chest pain, no fatigue, no polydipsia, no polyphagia and no polyuria. Pertinent negatives for hypoglycemia complications include no blackouts, no hospitalization, no nocturnal hypoglycemia, no required assistance and no required glucagon injection. He is following a diabetic diet. Meal planning includes carbohydrate counting. He has not had a previous visit with a dietitian. His breakfast blood glucose range is generally 110-130 mg/dl.   Past Medical History:  Diagnosis Date  . Depression   . Diabetes mellitus    Social History   Social History  . Marital status: Single    Spouse name: N/A  . Number of children: N/A  . Years of education: N/A   Occupational History  . Not on file.   Social History Main Topics  . Smoking status: Former Research scientist (life sciences)  . Smokeless tobacco: Never Used  . Alcohol use Yes     Comment: occ  . Drug use: Yes    Types: Marijuana     Comment: 1-2 times a month. occ  . Sexual activity: Yes   Other Topics Concern  . Not on file   Social History Narrative  . No narrative on file   Allergies  Allergen Reactions  . Sulfa Antibiotics     "just knows he's allergic"   Immunization History  Administered Date(s) Administered  . Pneumococcal Polysaccharide-23  10/25/2016  . Tdap 10/25/2016   Diabetic Foot Exam - Simple   No data filed      Review of Systems  Constitutional: Negative for fatigue.  HENT: Negative.   Eyes: Negative for photophobia.  Respiratory: Negative.   Cardiovascular: Negative for chest pain and leg swelling.  Gastrointestinal: Negative.   Endocrine: Negative for polydipsia, polyphagia and polyuria.  Genitourinary: Positive for dysuria.  Musculoskeletal: Negative.   Skin: Negative.   Neurological: Negative.   Hematological: Negative.   Psychiatric/Behavioral: Negative.        Objective:   Physical Exam  Constitutional: He is oriented to person, place, and time.  HENT:  Head: Normocephalic and atraumatic.  Right Ear: External ear normal.  Left Ear: External ear normal.  Nose: Nose normal.  Mouth/Throat: Oropharynx is clear and moist.  Eyes: Pupils are equal, round, and reactive to light. Conjunctivae and EOM are normal.  Neck: Normal range of motion. Neck supple.  Cardiovascular: Normal rate, regular rhythm, normal heart sounds and intact distal pulses.   Pulmonary/Chest: Effort normal and breath sounds normal.  Abdominal: Soft. Bowel sounds are normal.  Musculoskeletal: Normal range of motion.  Neurological: He is alert and oriented to person, place, and time. He has normal reflexes.  Skin: Skin is warm and dry.  Psychiatric: He has a normal mood and affect. His behavior is normal. Judgment and thought content normal.      Assessment & Plan:  1. Type 1  diabetes mellitus without complication (HCC) Hemoglobin a1C has improved to 8.0, will continue medications at current dosage.   - POCT A1C - Blood Glucose Monitoring Suppl (TRUE METRIX AIR GLUCOSE METER) w/Device KIT; 1 each by Does not apply route 4 (four) times daily -  with meals and at bedtime.  Dispense: 1 kit; Refill: 0 - Blood Glucose Monitoring Suppl (TRUE METRIX AIR GLUCOSE METER) w/Device KIT; 1 each by Does not apply route 4 (four) times daily  -  with meals and at bedtime.  Dispense: 1 kit; Refill: 0 - insulin aspart (NOVOLOG) 100 UNIT/ML injection; Inject 0-15 Units into the skin 3 (three) times daily before meals.  Dispense: 10 mL; Refill: prn - Insulin Glargine (LANTUS SOLOSTAR) 100 UNIT/ML Solostar Pen; Inject 20 Units into the skin daily at 10 pm.  Dispense: 5 pen; Refill: PRN - BASIC METABOLIC PANEL WITH GFR  Your A1C goal is less than 7. Your fasting blood sugar  Upon awakening goal is between 110-140.  Your LDL  (bad cholesterol goal is less than 100 Blood pressure goal is <140/90.  Recommend a lowfat, low carbohydrate diet divided over 5-6 small meals, increase water intake to 6-8 glasses, and 150 minutes per week of cardiovascular exercise.   Take your medications as prescribed Make sure that you are familiar with each one of your medications and what they are used to treat.  If you are unsure of medications, please bring to follow up Will send referral for eye exam  Please keep your scheduled follow up appointment.   2. Dysuria Recommend barrier protection with sexual intercourse - GC/Chlamydia Probe Amp   RTC: 3 months for DM type [place]  MSN, FNP-C [facility] [address] [place], [place] [address] [phone]

## 2016-11-27 NOTE — Patient Instructions (Signed)

## 2016-11-29 LAB — GC/CHLAMYDIA PROBE AMP
Chlamydia trachomatis, NAA: NEGATIVE
NEISSERIA GONORRHOEAE BY PCR: NEGATIVE

## 2016-11-30 ENCOUNTER — Telehealth: Payer: Self-pay

## 2016-11-30 NOTE — Telephone Encounter (Signed)
Called and spoke with patient, advised that gc/chlamydia was negative and that he should continue to use barrier protection such as condoms with sexual intercourse. Thanks!

## 2016-11-30 NOTE — Telephone Encounter (Signed)
-----   Message from Massie MaroonLachina M Hollis, [place] sent at 11/29/2016  6:36 PM EDT ----- Regarding: lab results Please inform patient that gc/chlamydia was negative. Recommend that he continues to use barrier protection with sexual intercourse.   Thanks ----- Message ----- From: Leory PlowmanInterface, [facility] [place] Sent: 11/27/2016  11:40 AM To: Massie MaroonLachina M Hollis, FNP

## 2016-12-04 ENCOUNTER — Ambulatory Visit: Payer: Self-pay | Attending: Family Medicine

## 2016-12-07 ENCOUNTER — Encounter (HOSPITAL_BASED_OUTPATIENT_CLINIC_OR_DEPARTMENT_OTHER): Payer: Self-pay | Admitting: Emergency Medicine

## 2016-12-07 ENCOUNTER — Emergency Department (HOSPITAL_BASED_OUTPATIENT_CLINIC_OR_DEPARTMENT_OTHER)
Admission: EM | Admit: 2016-12-07 | Discharge: 2016-12-07 | Disposition: A | Discharge status: Home / Self Care | Payer: Self-pay | Attending: Emergency Medicine | Admitting: Emergency Medicine

## 2016-12-07 DIAGNOSIS — E10649 Type 1 diabetes mellitus with hypoglycemia without coma: Secondary | ICD-10-CM | POA: Insufficient documentation

## 2016-12-07 DIAGNOSIS — Z87891 Personal history of nicotine dependence: Secondary | ICD-10-CM | POA: Insufficient documentation

## 2016-12-07 DIAGNOSIS — Z79899 Other long term (current) drug therapy: Secondary | ICD-10-CM | POA: Insufficient documentation

## 2016-12-07 DIAGNOSIS — E162 Hypoglycemia, unspecified: Secondary | ICD-10-CM

## 2016-12-07 LAB — URINALYSIS, ROUTINE W REFLEX MICROSCOPIC
Bilirubin Urine: NEGATIVE
GLUCOSE, UA: NEGATIVE mg/dL
KETONES UR: NEGATIVE mg/dL
Leukocytes, UA: NEGATIVE
Nitrite: NEGATIVE
PH: 6 (ref 5.0–8.0)
Protein, ur: NEGATIVE mg/dL
Specific Gravity, Urine: <1.005 — ABNORMAL LOW (ref 1.005–1.030)

## 2016-12-07 LAB — CBC WITH DIFFERENTIAL/PLATELET
Basophils Absolute: 0 10*3/uL (ref 0.0–0.1)
Basophils Relative: 0 %
EOS ABS: 0.1 10*3/uL (ref 0.0–0.7)
EOS PCT: 2 %
HCT: 45.6 % (ref 39.0–52.0)
HEMOGLOBIN: 15.7 g/dL (ref 13.0–17.0)
LYMPHS ABS: 1.7 10*3/uL (ref 0.7–4.0)
LYMPHS PCT: 25 %
MCH: 28.6 pg (ref 26.0–34.0)
MCHC: 34.4 g/dL (ref 30.0–36.0)
MCV: 83.2 fL (ref 78.0–100.0)
MONOS PCT: 9 %
Monocytes Absolute: 0.6 10*3/uL (ref 0.1–1.0)
NEUTROS PCT: 64 %
Neutro Abs: 4.5 10*3/uL (ref 1.7–7.7)
Platelets: 199 10*3/uL (ref 150–400)
RBC: 5.48 MIL/uL (ref 4.22–5.81)
RDW: 12.3 % (ref 11.5–15.5)
WBC: 6.9 10*3/uL (ref 4.0–10.5)

## 2016-12-07 LAB — CBG MONITORING, ED
GLUCOSE-CAPILLARY: 44 mg/dL — AB (ref 65–99)
GLUCOSE-CAPILLARY: 31 mg/dL — AB (ref 65–99)
GLUCOSE-CAPILLARY: 85 mg/dL (ref 65–99)
GLUCOSE-CAPILLARY: 88 mg/dL (ref 65–99)
GLUCOSE-CAPILLARY: 94 mg/dL (ref 65–99)
GLUCOSE-CAPILLARY: 248 mg/dL — AB (ref 65–99)
Glucose-Capillary: 66 mg/dL (ref 65–99)

## 2016-12-07 LAB — BASIC METABOLIC PANEL
Anion gap: 5 (ref 5–15)
BUN: 9 mg/dL (ref 6–20)
CALCIUM: 9.1 mg/dL (ref 8.9–10.3)
CO2: 31 mmol/L (ref 22–32)
CREATININE: 1.02 mg/dL (ref 0.61–1.24)
Chloride: 104 mmol/L (ref 101–111)
GFR calc Af Amer: >60 mL/min (ref >60)
GFR calc non Af Amer: >60 mL/min (ref >60)
GLUCOSE: 40 mg/dL — AB (ref 65–99)
Potassium: 3.9 mmol/L (ref 3.5–5.1)
Sodium: 140 mmol/L (ref 135–145)

## 2016-12-07 LAB — URINALYSIS, MICROSCOPIC (REFLEX)
Squamous Epithelial / LPF: NONE SEEN
WBC, UA: NONE SEEN WBC/hpf (ref 0–5)

## 2016-12-07 LAB — LIPASE, BLOOD: Lipase: 26 U/L (ref 11–51)

## 2016-12-07 MED ORDER — DEXTROSE 50 % IV SOLN
INTRAVENOUS | Status: AC
  Administered 2016-12-07: 25 g via INTRAVENOUS
  Filled 2016-12-07: qty 50

## 2016-12-07 MED ORDER — DEXTROSE 50 % IV SOLN
25.0000 g | Freq: Once | INTRAVENOUS | Status: AC
  Administered 2016-12-07: 25 g via INTRAVENOUS

## 2016-12-07 MED ORDER — DEXTROSE 50 % IV SOLN
1.0000 | Freq: Once | INTRAVENOUS | Status: AC
  Administered 2016-12-07: 50 mL via INTRAVENOUS
  Filled 2016-12-07: qty 50

## 2016-12-07 NOTE — ED Notes (Signed)
Patient educated on discharge instructions. Patient denies any further requests.

## 2016-12-07 NOTE — ED Notes (Signed)
Patient given 8 oz of juice and crackers.

## 2016-12-07 NOTE — ED Notes (Signed)
Pt was given milk, coke, crackers, and peanut butter out in triage. Despite interventions BGL remains low. EDP notified.

## 2016-12-07 NOTE — ED Triage Notes (Signed)
Patient states that he felt shaky  earlier today and he checked his sugar and it was 30. The patient has a BS of 44 in triage.

## 2016-12-07 NOTE — ED Provider Notes (Signed)
[facility] DEPT [facility] Provider Note   CSN: [id number] Arrival date & time: 12/07/16  1716     History   Chief Complaint Chief Complaint  Patient presents with  . Hypoglycemia    HPI Glenn Ball is a 26 y.o. male.  HPI   26 year old IDDM, type1 male here with hypoglycemia.  Pt without hx of DKA or having many hypoglycemic episodes.  Today while at work he felt dizzy and lightheadedness.  He checked his CBG and it was 30, approximately 2 hrs ago.  Friend brought pt here.  Pt did report having bad headache and feeling mental fogginess, and notice CBG low during those episodes, improves with eating/drinking.  Has noticed urine is dark and malodorous but without dysuria, urgency or frequency.  Does try to stay hydrated.  Does admits that he doesn't often check his CBG prior to dose his insulin.  Today he took 20 unit of short acting insulin prior to lunch.  He normally takes his basal insulin at midnight.  Does report that his PCP have increased his basal insulin to 20 unit nightly approximately 3 weeks ago.  Denies any recent sickness, denies alcohol abuse.  Currently denies having pain.  Pt felt better after eating crackers and drinking juice in the ER.    Past Medical History:  Diagnosis Date  . Depression   . Diabetes mellitus     Patient Active Problem List   Diagnosis Date Noted  . Dehydration 10/14/2011  . Dizziness 10/14/2011  . Weight loss 10/14/2011  . Diabetes mellitus, new onset ([place]) 10/13/2011  . Hyperglycemia 10/13/2011    History reviewed. No pertinent surgical history.     Home Medications    Prior to Admission medications   Medication Sig Start Date End Date Taking? Authorizing Provider  Blood Glucose Monitoring Suppl (TRUE METRIX AIR GLUCOSE METER) w/Device KIT 1 each by Does not apply route 4 (four) times daily -  with meals and at bedtime. 11/27/16   Dorena Dew, FNP  Blood Glucose Monitoring Suppl (TRUE METRIX AIR GLUCOSE METER) w/Device KIT 1  each by Does not apply route 4 (four) times daily -  with meals and at bedtime. 11/27/16   Dorena Dew, FNP  glucose blood (TRUE METRIX BLOOD GLUCOSE TEST) test strip Check blood sugars 3 times per day prior to meals and at bedtime. Use as instructed 10/25/16   Dorena Dew, FNP  insulin aspart (NOVOLOG) 100 UNIT/ML injection Inject 0-15 Units into the skin 3 (three) times daily before meals. 11/27/16   Dorena Dew, FNP  insulin aspart (NOVOLOG) 100 UNIT/ML injection INJECT 0-15 UNITS INTO THE SKIN 3 TIMES DAILY BEFORE MEALS. 11/27/16   Dorena Dew, FNP  Insulin Glargine (LANTUS SOLOSTAR) 100 UNIT/ML Solostar Pen Inject 20 Units into the skin daily at 10 pm. 11/27/16   Dorena Dew, FNP  Lancet Device MISC 1 each by Does not apply route 4 (four) times daily -  before meals and at bedtime. 10/25/16   Dorena Dew, [place] TO FIND Med Name: 120 syringes and needles for insulin 10/15/11   Bynum Bellows, MD  UNABLE TO FIND Please excuse Mr. Zakee Deerman from any work or school obligations from 10/13/2011 till 10/29/2011 as he was hospitalized. 10/15/11   Bynum Bellows, MD    Family History Family History  Problem Relation Age of Onset  . Hypertension Mother   . Cancer Maternal Grandmother   . Hypertension Maternal Grandfather   .  Diabetes Maternal Grandfather     Social History Social History  Substance Use Topics  . Smoking status: Former Research scientist (life sciences)  . Smokeless tobacco: Never Used  . Alcohol use Yes     Comment: occ     Allergies   Sulfa antibiotics   Review of Systems Review of Systems  All other systems reviewed and are negative.    Physical Exam Updated Vital Signs BP 131/69 (BP Location: Left Arm)   Pulse 77   Temp 98.2 F (36.8 C) (Oral)   Resp 18   Ht '5\' 9"'  (1.753 m)   Wt 67.6 kg (149 lb)   SpO2 100%   BMI 22.00 kg/m   Physical Exam  Constitutional: He is oriented to person, place, and time. He appears well-developed and  well-nourished. No distress.  HENT:  Head: Atraumatic.  Eyes: Conjunctivae are normal.  Neck: Neck supple.  Cardiovascular: Normal rate and regular rhythm.   Pulmonary/Chest: Effort normal and breath sounds normal.  Abdominal: Soft. He exhibits no distension. There is no tenderness.  Neurological: He is alert and oriented to person, place, and time. He has normal strength. No cranial nerve deficit or sensory deficit. GCS eye subscore is 4. GCS verbal subscore is 5. GCS motor subscore is 6.  Skin: No rash noted.  Psychiatric: He has a normal mood and affect.  Nursing note and vitals reviewed.    ED Treatments / Results  Labs (all labs ordered are listed, but only abnormal results are displayed) Labs Reviewed  BASIC METABOLIC PANEL - Abnormal; Notable for the following:       Result Value   Glucose, Bld 40 (*)    All other components within normal limits  URINALYSIS, ROUTINE W REFLEX MICROSCOPIC - Abnormal; Notable for the following:    Specific Gravity, Urine <1.005 (*)    Hgb urine dipstick SMALL (*)    All other components within normal limits  URINALYSIS, MICROSCOPIC (REFLEX) - Abnormal; Notable for the following:    Bacteria, UA RARE (*)    All other components within normal limits  CBG MONITORING, ED - Abnormal; Notable for the following:    Glucose-Capillary 44 (*)    All other components within normal limits  CBG MONITORING, ED - Abnormal; Notable for the following:    Glucose-Capillary 31 (*)    All other components within normal limits  CBG MONITORING, ED - Abnormal; Notable for the following:    Glucose-Capillary 248 (*)    All other components within normal limits  LIPASE, BLOOD  CBC WITH DIFFERENTIAL/PLATELET  CBG MONITORING, ED  CBG MONITORING, ED  CBG MONITORING, ED  CBG MONITORING, ED    EKG  EKG Interpretation None       Radiology No results found.  Procedures Procedures (including critical care time)  Medications Ordered in ED Medications    dextrose 50 % solution 25 g (25 g Intravenous Given 12/07/16 1752)  dextrose 50 % solution 50 mL (50 mLs Intravenous Given 12/07/16 2010)     Initial Impression / Assessment and Plan / ED Course  I have reviewed the triage vital signs and the nursing notes.  Pertinent labs & imaging results that were available during my care of the patient were reviewed by me and considered in my medical decision making (see chart for details).     BP 139/88   Pulse 61   Temp 98.2 F (36.8 C) (Oral)   Resp 20   Ht '5\' 9"'  (1.753 m)   Wt 67.6  kg (149 lb)   SpO2 100%   BMI 22.00 kg/m    Final Clinical Impressions(s) / ED Diagnoses   Final diagnoses:  Hypoglycemia    New Prescriptions New Prescriptions   No medications on file   6:35 PM Pt here with hypoglycemia episode, documented CBG of 31 in the ER at initial onset.  Pt received 1amp of D50 along with juice and crackers.  It has improved to 85.  He is mentating appropriately.  No recent sickness.  I suspect his hypoglycemic episode is due to inappropriate dosing of his short acting insulin.  Will check basic labs, continue to monitor.  Pt does have a PCP that he should f/u closely.  He will also benefit from a thorough educational teaching from a diabetic coordinator.    9:12 PM CBG improves.  Pt felt fine.  He is stable to be discharge.  He understand the importance of f/u with his provider for further management.  Return precaution given.  Care discussed with Dr. Rogene Houston.     Domenic Moras, PA-C 12/07/16 2120    Fredia Sorrow, MD 12/09/16 [phone]

## 2016-12-07 NOTE — ED Notes (Signed)
ED Provider at bedside. 

## 2016-12-07 NOTE — ED Notes (Signed)
Primary rn Adam notified of critical glucose of 40, pt has been given d50 since that lab was drawn.

## 2016-12-07 NOTE — ED Notes (Signed)
Pt states he is supposed to be on sliding scale insulin and the most he would take is 10 units of Novalog. Pt has not been checking his BGL before meals and just administering his insulin. Today at lunch pt felt his sugar was higher so he took 20 units.

## 2016-12-07 NOTE — ED Notes (Signed)
Patient blood glucose 85 mg/dL

## 2016-12-07 NOTE — Discharge Instructions (Signed)
Please follow up closely with your primary care provider for further management of your diabetes.  You must check your blood sugar regularly before administering your insulin as it may varies greatly.  Return if you have any concerns.

## 2016-12-07 NOTE — ED Notes (Signed)
Patient reports "feeling foggy" while at work prior to arrival

## 2016-12-22 ENCOUNTER — Other Ambulatory Visit: Payer: Self-pay | Admitting: *Deleted

## 2016-12-22 DIAGNOSIS — E109 Type 1 diabetes mellitus without complications: Secondary | ICD-10-CM

## 2016-12-22 MED ORDER — INSULIN GLARGINE 100 UNIT/ML SOLOSTAR PEN: 20.0000 [IU] | PEN_INJECTOR | Freq: Every day | SUBCUTANEOUS | 3 refills | Status: DC

## 2016-12-22 MED ORDER — INSULIN LISPRO 100 UNIT/ML CARTRIDGE: 0.0000 [IU] | Freq: Three times a day (TID) | SUBCUTANEOUS | 3 refills | Status: DC

## 2016-12-22 NOTE — Telephone Encounter (Signed)
PRINTED FOR PASS PROGRAM 

## 2017-01-24 MED FILL — ?HUMALOG 100 UNITS/ML VIAL: 100 | 22 days supply | Qty: 10 | Fill #0

## 2017-01-24 MED FILL — $LANTUS SOLOSTAR 100 UNITS/: 100 | 30 days supply | Qty: 6 | Fill #0

## 2017-02-26 ENCOUNTER — Ambulatory Visit: Payer: Self-pay | Admitting: Family Medicine

## 2017-03-02 ENCOUNTER — Ambulatory Visit (INDEPENDENT_AMBULATORY_CARE_PROVIDER_SITE_OTHER): Payer: Self-pay | Admitting: Family Medicine

## 2017-03-02 ENCOUNTER — Ambulatory Visit: Payer: Self-pay | Admitting: Family Medicine

## 2017-03-02 ENCOUNTER — Encounter: Payer: Self-pay | Admitting: Family Medicine

## 2017-03-02 VITALS — BP 117/71 | HR 60 | Temp 98.2°F | Resp 16 | Ht 69.0 in | Wt 150.0 lb

## 2017-03-02 DIAGNOSIS — E109 Type 1 diabetes mellitus without complications: Secondary | ICD-10-CM

## 2017-03-02 DIAGNOSIS — E119 Type 2 diabetes mellitus without complications: Secondary | ICD-10-CM

## 2017-03-02 LAB — GLUCOSE, CAPILLARY: GLUCOSE-CAPILLARY: 262 mg/dL — AB (ref 65–99)

## 2017-03-02 LAB — POCT GLYCOSYLATED HEMOGLOBIN (HGB A1C): Hemoglobin A1C: 9.8

## 2017-03-02 MED ORDER — GLUCOSE BLOOD VI STRP: ORAL_STRIP | 11 refills | Status: DC

## 2017-03-02 MED ORDER — LANCET DEVICE MISC: 1.0000 | Freq: Three times a day (TID) | 11 refills | Status: AC

## 2017-03-02 MED ORDER — INSULIN GLARGINE 100 UNIT/ML SOLOSTAR PEN: 25.0000 [IU] | PEN_INJECTOR | Freq: Every day | SUBCUTANEOUS | 3 refills | Status: DC

## 2017-03-02 MED ORDER — INSULIN ASPART 100 UNIT/ML ~~LOC~~ SOLN: 0.0000 [IU] | Freq: Three times a day (TID) | SUBCUTANEOUS | 11 refills | Status: DC

## 2017-03-02 MED FILL — TRUE METRIX TEST STRIP: 30 days supply | Qty: 100 | Fill #0

## 2017-03-02 MED FILL — !NOVOLOG 100UNITS/ML VIAL: 100/ML | 22 days supply | Qty: 10 | Fill #0

## 2017-03-02 MED FILL — TRUEplus LANCETS 28G MISC: 30 days supply | Qty: 100 | Fill #0

## 2017-03-02 MED FILL — $LANTUS SOLOSTAR 100 UNITS/: 100 | 24 days supply | Qty: 6 | Fill #0

## 2017-03-02 NOTE — Patient Instructions (Addendum)
Hemoglobin a1c is 9.8, will make the following changes:   Will increase Lantus to 25 units at bedtime.  Continue Novolog per sliding scale for meal coverage. Your A1C goal is less than 7. Your fasting blood sugar  Upon awakening goal is between 110-140.  Recommend a lowfat, low carbohydrate diet divided over 5-6 small meals, increase water intake to 6-8 glasses, and 150 minutes per week of cardiovascular exercise.   Take your medications as prescribed Make sure that you are familiar with each one of your medications and what they are used to treat.  If you are unsure of medications, please bring to follow up Will send referral for eye exam  Please keep your scheduled follow up appointment.    Carbohydrate Counting for Diabetes Mellitus, Adult Carbohydrate counting is a method for keeping track of how many carbohydrates you eat. Eating carbohydrates naturally increases the amount of sugar (glucose) in the blood. Counting how many carbohydrates you eat helps keep your blood glucose within normal limits, which helps you manage your diabetes (diabetes mellitus). It is important to know how many carbohydrates you can safely have in each meal. This is different for every person. A diet and nutrition specialist (registered dietitian) can help you make a meal plan and calculate how many carbohydrates you should have at each meal and snack. Carbohydrates are found in the following foods:  Grains, such as breads and cereals.  Dried beans and soy products.  Starchy vegetables, such as potatoes, peas, and corn.  Fruit and fruit juices.  Milk and yogurt.  Sweets and snack foods, such as cake, cookies, candy, chips, and soft drinks.  How do I count carbohydrates? There are two ways to count carbohydrates in food. You can use either of the methods or a combination of both. Reading "Nutrition Facts" on packaged food The "Nutrition Facts" list is included on the labels of almost all packaged foods  and beverages in the [place] It includes:  The serving size.  Information about nutrients in each serving, including the grams (g) of carbohydrate per serving.  To use the "Nutrition Facts":  Decide how many servings you will have.  Multiply the number of servings by the number of carbohydrates per serving.  The resulting number is the total amount of carbohydrates that you will be having.  Learning standard serving sizes of other foods When you eat foods containing carbohydrates that are not packaged or do not include "Nutrition Facts" on the label, you need to measure the servings in order to count the amount of carbohydrates:  Measure the foods that you will eat with a food scale or measuring cup, if needed.  Decide how many standard-size servings you will eat.  Multiply the number of servings by 15. Most carbohydrate-rich foods have about 15 g of carbohydrates per serving. ? For example, if you eat 8 oz (170 g) of strawberries, you will have eaten 2 servings and 30 g of carbohydrates (2 servings x 15 g = 30 g).  For foods that have more than one food mixed, such as soups and casseroles, you must count the carbohydrates in each food that is included.  The following list contains standard serving sizes of common carbohydrate-rich foods. Each of these servings has about 15 g of carbohydrates:   hamburger bun or  English muffin.   oz (15 mL) syrup.   oz (14 g) jelly.  1 slice of bread.  1 six-inch tortilla.  3 oz (85 g) cooked rice or pasta.  4 oz (113 g) cooked dried beans.  4 oz (113 g) starchy vegetable, such as peas, corn, or potatoes.  4 oz (113 g) hot cereal.  4 oz (113 g) mashed potatoes or  of a large baked potato.  4 oz (113 g) canned or frozen fruit.  4 oz (120 mL) fruit juice.  4-6 crackers.  6 chicken nuggets.  6 oz (170 g) unsweetened dry cereal.  6 oz (170 g) plain fat-free yogurt or yogurt sweetened with artificial sweeteners.  8 oz (240  mL) milk.  8 oz (170 g) fresh fruit or one small piece of fruit.  24 oz (680 g) popped popcorn.  Example of carbohydrate counting Sample meal  3 oz (85 g) chicken breast.  6 oz (170 g) brown rice.  4 oz (113 g) corn.  8 oz (240 mL) milk.  8 oz (170 g) strawberries with sugar-free whipped topping. Carbohydrate calculation 1. Identify the foods that contain carbohydrates: ? Rice. ? Corn. ? Milk. ? Strawberries. 2. Calculate how many servings you have of each food: ? 2 servings rice. ? 1 serving corn. ? 1 serving milk. ? 1 serving strawberries. 3. Multiply each number of servings by 15 g: ? 2 servings rice x 15 g = 30 g. ? 1 serving corn x 15 g = 15 g. ? 1 serving milk x 15 g = 15 g. ? 1 serving strawberries x 15 g = 15 g. 4. Add together all of the amounts to find the total grams of carbohydrates eaten: ? 30 g + 15 g + 15 g + 15 g = 75 g of carbohydrates total. This information is not intended to replace advice given to you by your health care provider. Make sure you discuss any questions you have with your health care provider. Document Released: 03/06/2005 Document Revised: 09/24/2015 Document Reviewed: 08/18/2015 Elsevier Interactive Patient Education  [employer]2018 [employer] Inc.

## 2017-03-02 NOTE — Progress Notes (Signed)
Subjective:    Patient ID: Glenn Ball, male    DOB: 06/21/1990, 26 y.o.   MRN: [id number]  Glenn Ball, a 26 year old male with a history of type 1 diabetes mellitus. He has been taking Lantus 20 units consistently. He says that he was diagnosed with type 1 diabetes at age 89. He has not been checking blood sugars consistently, he says that he has been out of test strips.  .His clinical course has fluctuated. Associated symptoms of hyperglycemia have been excessive thirst and polyuria.   Patient denies foot ulcerations, hyperglycemia, hypoglycemia , nausea, paresthesia of the feet, visual disturbances, vomitting and weight loss.    Diabetes  He has type 1 diabetes mellitus. His disease course has been stable. Pertinent negatives for diabetes include no chest pain, no fatigue, no polydipsia, no polyphagia and no polyuria. Pertinent negatives for hypoglycemia complications include no blackouts, no hospitalization, no nocturnal hypoglycemia, no required assistance and no required glucagon injection. His weight is stable. He is following a diabetic diet. Meal planning includes carbohydrate counting. He has not had a previous visit with a dietitian. His breakfast blood glucose range is generally >200 mg/dl. An ACE inhibitor/angiotensin II receptor blocker is not being taken. He does not see a podiatrist.Eye exam is not current.   Past Medical History:  Diagnosis Date  . Depression   . Diabetes mellitus    Social History   Socioeconomic History  . Marital status: Single    Spouse name: Not on file  . Number of children: Not on file  . Years of education: Not on file  . Highest education level: Not on file  Social Needs  . Financial resource strain: Not on file  . Food insecurity - worry: Not on file  . Food insecurity - inability: Not on file  . Transportation needs - medical: Not on file  . Transportation needs - non-medical: Not on file  Occupational History  . Not on file  Tobacco Use   . Smoking status: Former Games developermoker  . Smokeless tobacco: Never Used  Substance and Sexual Activity  . Alcohol use: Yes    Comment: occ  . Drug use: Yes    Types: Marijuana    Comment: 1-2 times a month. occ  . Sexual activity: Yes  Other Topics Concern  . Not on file  Social History Narrative  . Not on file   Allergies  Allergen Reactions  . Sulfa Antibiotics     "just knows he's allergic"   Immunization History  Administered Date(s) Administered  . Pneumococcal Polysaccharide-23 10/25/2016  . Tdap 10/25/2016   Diabetic Foot Exam - Simple   No data filed      Review of Systems  Constitutional: Negative for fatigue.  HENT: Negative.   Eyes: Negative for photophobia.  Respiratory: Negative.   Cardiovascular: Negative for chest pain and leg swelling.  Gastrointestinal: Negative.   Endocrine: Negative for polydipsia, polyphagia and polyuria.  Genitourinary: Positive for dysuria.  Musculoskeletal: Negative.   Skin: Negative.   Neurological: Negative.   Hematological: Negative.   Psychiatric/Behavioral: Negative.        Objective:   Physical Exam  Constitutional: He is oriented to person, place, and time.  HENT:  Head: Normocephalic and atraumatic.  Right Ear: External ear normal.  Left Ear: External ear normal.  Nose: Nose normal.  Mouth/Throat: Oropharynx is clear and moist.  Eyes: Conjunctivae and EOM are normal. Pupils are equal, round, and reactive to light.  Neck: Normal range  of motion. Neck supple.  Cardiovascular: Normal rate, regular rhythm, normal heart sounds and intact distal pulses.  Pulmonary/Chest: Effort normal and breath sounds normal.  Abdominal: Soft. Bowel sounds are normal.  Musculoskeletal: Normal range of motion.  Neurological: He is alert and oriented to person, place, and time. He has normal reflexes.  Skin: Skin is warm and dry.  Psychiatric: He has a normal mood and affect. His behavior is normal. Judgment and thought content normal.   BP 117/71 (BP Location: Right Arm, Patient Position: Sitting, Cuff Size: Normal)   Pulse 60   Temp 98.2 F (36.8 C) (Oral)   Resp 16   Ht 5\' 9"  (1.753 m)   Wt 150 lb (68 kg)   SpO2 100%   BMI 22.15 kg/m     Assessment & Plan:  Type 1 diabetes mellitus without complication (HCC) Hemoglobin A1c has increased to 9.8, patient states that he has been taking medications consistently, however when probed patient states that he does not use sliding scale consistent.There have been some probable compliance issues here. I have discussed with him the great importance of following the treatment plan exactly as directed in order to achieve a good medical outcome. - Glucose, capillary - POCT A1C - glucose blood (TRUE METRIX BLOOD GLUCOSE TEST) test strip; Check blood sugars 3 times per day prior to meals and at bedtime. Use as instructed  Dispense: 100 each; Refill: 11 - insulin aspart (NOVOLOG) 100 UNIT/ML injection; Inject 0-15 Units into the skin 3 (three) times daily before meals.  Dispense: 10 mL; Refill: 11 - Lancet Device MISC; 1 each by Does not apply route 4 (four) times daily -  before meals and at bedtime.  Dispense: 100 each; Refill: 11 - Basic Metabolic Panel - Insulin Glargine (LANTUS SOLOSTAR) 100 UNIT/ML Solostar Pen; Inject 25 Units into the skin daily at 10 pm.  Dispense: 15 mL; Refill: 3 - Urinalysis, Routine w reflex microscopic  Your A1C goal is less than 7. Your fasting blood sugar  Upon awakening goal is between 110-140.  Your LDL  (bad cholesterol goal is less than 100 Blood pressure goal is <140/90.  Recommend a lowfat, low carbohydrate diet divided over 5-6 small meals, increase water intake to 6-8 glasses, and 150 minutes per week of cardiovascular exercise.   Take your medications as prescribed Make sure that you are familiar with each one of your medications and what they are used to treat.  If you are unsure of medications, please bring to follow up Will send referral  for eye exam  Please keep your scheduled follow up appointment.     RTC: 3 months for type 1 dm   Nolon NationsLachina Moore Emelio Schneller  MSN, FNP-C [facility] [address]  [place], [place] [id number] [phone]

## 2017-03-03 LAB — BASIC METABOLIC PANEL
BUN/Creatinine Ratio: 12 (ref 9–20)
BUN: 11 mg/dL (ref 6–20)
CO2: 26 mmol/L (ref 20–29)
CREATININE: 0.94 mg/dL (ref 0.76–1.27)
Calcium: 9.4 mg/dL (ref 8.7–10.2)
Chloride: 99 mmol/L (ref 96–106)
GFR calc Af Amer: 129 mL/min/{1.73_m2} (ref >59)
GFR, EST NON AFRICAN AMERICAN: 111 mL/min/{1.73_m2} (ref >59)
GLUCOSE: 245 mg/dL — AB (ref 65–99)
Potassium: 4.5 mmol/L (ref 3.5–5.2)
Sodium: 138 mmol/L (ref 134–144)

## 2017-03-03 LAB — URINALYSIS, ROUTINE W REFLEX MICROSCOPIC
BILIRUBIN UA: NEGATIVE
KETONES UA: NEGATIVE
Leukocytes, UA: NEGATIVE
NITRITE UA: NEGATIVE
PH UA: 6.5 (ref 5.0–7.5)
Protein, UA: NEGATIVE
RBC UA: NEGATIVE
Urobilinogen, Ur: 0.2 mg/dL (ref 0.2–1.0)

## 2017-04-02 MED FILL — TRUE METRIX TEST STRIP: 30 days supply | Qty: 100 | Fill #1

## 2017-04-03 MED FILL — ?HUMALOG 100 UNITS/ML VIAL: 100 | 22 days supply | Qty: 10 | Fill #1

## 2017-04-03 MED FILL — $LANTUS SOLOSTAR 100 UNITS/: 100 | 24 days supply | Qty: 6 | Fill #1

## 2017-04-27 ENCOUNTER — Encounter (HOSPITAL_BASED_OUTPATIENT_CLINIC_OR_DEPARTMENT_OTHER): Payer: Self-pay | Admitting: *Deleted

## 2017-04-27 ENCOUNTER — Emergency Department (HOSPITAL_BASED_OUTPATIENT_CLINIC_OR_DEPARTMENT_OTHER)
Admission: EM | Admit: 2017-04-27 | Discharge: 2017-04-27 | Disposition: A | Discharge status: Home / Self Care | Payer: Self-pay | Attending: Emergency Medicine | Admitting: Emergency Medicine

## 2017-04-27 ENCOUNTER — Emergency Department (HOSPITAL_BASED_OUTPATIENT_CLINIC_OR_DEPARTMENT_OTHER): Payer: Self-pay

## 2017-04-27 ENCOUNTER — Other Ambulatory Visit: Payer: Self-pay

## 2017-04-27 DIAGNOSIS — Y999 Unspecified external cause status: Secondary | ICD-10-CM | POA: Insufficient documentation

## 2017-04-27 DIAGNOSIS — Z79899 Other long term (current) drug therapy: Secondary | ICD-10-CM | POA: Insufficient documentation

## 2017-04-27 DIAGNOSIS — E109 Type 1 diabetes mellitus without complications: Secondary | ICD-10-CM | POA: Insufficient documentation

## 2017-04-27 DIAGNOSIS — Z794 Long term (current) use of insulin: Secondary | ICD-10-CM | POA: Insufficient documentation

## 2017-04-27 DIAGNOSIS — Y929 Unspecified place or not applicable: Secondary | ICD-10-CM | POA: Insufficient documentation

## 2017-04-27 DIAGNOSIS — Z87891 Personal history of nicotine dependence: Secondary | ICD-10-CM | POA: Insufficient documentation

## 2017-04-27 DIAGNOSIS — Y939 Activity, unspecified: Secondary | ICD-10-CM | POA: Insufficient documentation

## 2017-04-27 DIAGNOSIS — S62366A Nondisplaced fracture of neck of fifth metacarpal bone, right hand, initial encounter for closed fracture: Secondary | ICD-10-CM | POA: Insufficient documentation

## 2017-04-27 LAB — CBG MONITORING, ED
GLUCOSE-CAPILLARY: 44 mg/dL — AB (ref 65–99)
GLUCOSE-CAPILLARY: 129 mg/dL — AB (ref 65–99)

## 2017-04-27 MED ORDER — IBUPROFEN 800 MG PO TABS: 800.0000 mg | ORAL_TABLET | Freq: Three times a day (TID) | ORAL | 0 refills | Status: DC

## 2017-04-27 MED ORDER — IBUPROFEN 800 MG PO TABS
800.0000 mg | ORAL_TABLET | Freq: Once | ORAL | Status: AC
  Administered 2017-04-27: 800 mg via ORAL
  Filled 2017-04-27: qty 1
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
## 2017-04-27 NOTE — ED Notes (Signed)
Apple juice, graham crackers and peanut butter.

## 2017-04-27 NOTE — ED Notes (Signed)
Pt is A/Ox4 eating crackers and drinking juice. Will recheck CBG in 15 minutes.
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
## 2017-04-27 NOTE — ED Notes (Signed)
CMS intact before and after splint was applied. Patient had no further questions regarding care for the splint.

## 2017-04-27 NOTE — Discharge Instructions (Signed)
As discussed, follow the rice protocol outlined In these instructions and follow-up with Dr. Debby Budoley's office hand surgery.  Take ibuprofen for pain.  Return if symptoms worsen, increased swelling, loss of sensation, loss of color or any other new concerning symptoms in the meantime.

## 2017-04-27 NOTE — ED Notes (Signed)
ED Provider at bedside. 

## 2017-04-27 NOTE — ED Notes (Signed)
Pt updated on wait.  °

## 2017-05-11 ENCOUNTER — Ambulatory Visit: Payer: Self-pay

## 2017-05-16 MED FILL — $LANTUS SOLOSTAR 100 UNITS/: 100 | 24 days supply | Qty: 6 | Fill #2

## 2017-05-16 MED FILL — ?HUMALOG 100 UNITS/ML VIAL: 100 | 22 days supply | Qty: 10 | Fill #2

## 2017-05-18 MED FILL — TRUE METRIX TEST STRIP: 30 days supply | Qty: 100 | Fill #2

## 2017-06-01 ENCOUNTER — Ambulatory Visit (INDEPENDENT_AMBULATORY_CARE_PROVIDER_SITE_OTHER): Payer: Self-pay | Admitting: Family Medicine

## 2017-06-01 ENCOUNTER — Encounter: Payer: Self-pay | Admitting: Family Medicine

## 2017-06-01 VITALS — BP 122/71 | HR 70 | Temp 98.1°F | Resp 14 | Ht 68.0 in | Wt 149.0 lb

## 2017-06-01 DIAGNOSIS — E109 Type 1 diabetes mellitus without complications: Secondary | ICD-10-CM

## 2017-06-01 LAB — POCT URINALYSIS DIP (DEVICE)
BILIRUBIN URINE: NEGATIVE
GLUCOSE, UA: 500 mg/dL — AB
HGB URINE DIPSTICK: NEGATIVE
KETONES UR: NEGATIVE mg/dL
LEUKOCYTES UA: NEGATIVE
Nitrite: NEGATIVE
Protein, ur: NEGATIVE mg/dL
SPECIFIC GRAVITY, URINE: 1.025 (ref 1.005–1.030)
Urobilinogen, UA: 0.2 mg/dL (ref 0.0–1.0)
pH: 7 (ref 5.0–8.0)

## 2017-06-01 LAB — GLUCOSE, POCT (MANUAL RESULT ENTRY): POC Glucose: 235 mg/dl — AB (ref 70–99)

## 2017-06-01 LAB — POCT GLYCOSYLATED HEMOGLOBIN (HGB A1C)

## 2017-06-01 NOTE — Progress Notes (Signed)
Subjective:    Patient ID: Glenn Ball, male    DOB: March 15, 1991, 27 y.o.   MRN: [id number]  Glenn Ball, a 27 year old male with a history of type 1 diabetes mellitus. He has been taking Lantus 20 units consistently. He says that he was diagnosed with type 1 diabetes at age 47. Glenn Ball has been checking blood sugar consistently. His clinical course has fluctuated. Associated symptoms of hyperglycemia have been excessive thirst and polyuria.  Blood sugar 230 on arrival, he says that he went out drinking alcohol on last night. He says that he did not eat or take insulin this am.  Patient denies foot ulcerations, hyperglycemia, hypoglycemia , nausea, paresthesia of the feet, visual disturbances, vomitting and weight loss.    Diabetes  He has type 1 diabetes mellitus. His disease course has been stable. Pertinent negatives for diabetes include no chest pain, no fatigue, no polydipsia, no polyphagia and no polyuria. Pertinent negatives for hypoglycemia complications include no blackouts, no hospitalization, no nocturnal hypoglycemia, no required assistance and no required glucagon injection. His weight is stable. He is following a diabetic diet. Meal planning includes carbohydrate counting. He has not had a previous visit with a dietitian. His breakfast blood glucose range is generally >200 mg/dl. His lunch blood glucose range is generally 140-180 mg/dl. His bedtime blood glucose range is generally 130-140 mg/dl. An ACE inhibitor/angiotensin II receptor blocker is not being taken. He does not see a podiatrist.Eye exam is current.   Past Medical History:  Diagnosis Date  . Depression   . Diabetes mellitus    Social History   Socioeconomic History  . Marital status: Single    Spouse name: Not on file  . Number of children: Not on file  . Years of education: Not on file  . Highest education level: Not on file  Social Needs  . Financial resource strain: Not on file  . Food insecurity - worry: Not on  file  . Food insecurity - inability: Not on file  . Transportation needs - medical: Not on file  . Transportation needs - non-medical: Not on file  Occupational History  . Not on file  Tobacco Use  . Smoking status: Former Games developer  . Smokeless tobacco: Never Used  Substance and Sexual Activity  . Alcohol use: Yes    Comment: occ  . Drug use: Yes    Types: Marijuana    Comment: 1-2 times a month. occ  . Sexual activity: Yes  Other Topics Concern  . Not on file  Social History Narrative  . Not on file   Allergies  Allergen Reactions  . Sulfa Antibiotics     "just knows he's allergic"   Immunization History  Administered Date(s) Administered  . Pneumococcal Polysaccharide-23 10/25/2016  . Tdap 10/25/2016   Diabetic Foot Exam - Simple   No data filed      Review of Systems  Constitutional: Negative for fatigue.  HENT: Negative.   Eyes: Negative for photophobia.  Respiratory: Negative.   Cardiovascular: Negative for chest pain and leg swelling.  Gastrointestinal: Negative.   Endocrine: Negative for polydipsia, polyphagia and polyuria.  Genitourinary: Positive for dysuria.  Musculoskeletal: Negative.   Skin: Negative.   Neurological: Negative.   Hematological: Negative.   Psychiatric/Behavioral: Negative.        Objective:   Physical Exam  Constitutional: He is oriented to person, place, and time.  HENT:  Head: Normocephalic and atraumatic.  Right Ear: External ear normal.  Left Ear:  External ear normal.  Nose: Nose normal.  Mouth/Throat: Oropharynx is clear and moist.  Eyes: Conjunctivae and EOM are normal. Pupils are equal, round, and reactive to light.  Neck: Normal range of motion. Neck supple.  Cardiovascular: Normal rate, regular rhythm, normal heart sounds and intact distal pulses.  Pulmonary/Chest: Effort normal and breath sounds normal.  Abdominal: Soft. Bowel sounds are normal.  Musculoskeletal: Normal range of motion.  Neurological: He is alert  and oriented to person, place, and time. He has normal reflexes.  Skin: Skin is warm and dry.  Psychiatric: He has a normal mood and affect. His behavior is normal. Judgment and thought content normal.  BP 122/71 (BP Location: Right Arm, Patient Position: Sitting, Cuff Size: Normal)   Pulse 70   Temp 98.1 F (36.7 C) (Oral)   Resp 14   Ht 5\' 8"  (1.727 m)   Wt 149 lb (67.6 kg)   SpO2 100%   BMI 22.66 kg/m     Assessment & Plan:  Type 1 diabetes mellitus without complication (HCC) Hemoglobin A1c would not read on meter. Patient states that he has been taking medications consistently, however when probed patient states that he does not use sliding scale consistent.There have been some probable compliance issues here. I have discussed with him the great importance of following the treatment plan exactly as directed in order to achieve a good medical outcome. Patient's  A1C goal is less than 7. Your fasting blood sugar  Upon awakening goal is between 110-140.   Blood pressure goal is <140/90.  Recommend a lowfat, low carbohydrate diet divided over 5-6 small meals, increase water intake to 6-8 glasses, and 150 minutes per week of cardiovascular exercise.   Take your medications as prescribed Make sure that you are familiar with each one of your medications and what they are used to treat.  If you are unsure of medications, please bring to follow up Will send referral for eye exam  Please keep your scheduled follow up appointment.   HgB A1c - Glucose (CBG) - POCT urinalysis dip (device) - Hemoglobin A1c - Basic Metabolic Panel  RTC: 3 months for type DM1  The patient was given clear instructions to go to ER or return to medical center if symptoms do not improve, worsen or new problems develop. The patient verbalized understanding. Will notify patient with laboratory results.   Nolon NationsLachina Moore Keion Neels  MSN, FNP-C [facility] [address]   [place], [place] [id number] [phone]

## 2017-06-01 NOTE — Patient Instructions (Signed)
Will follow up by phone with any abnormal laboratory results.     Blood Glucose Monitoring, Adult Monitoring your blood sugar (glucose) helps you manage your diabetes. It also helps you and your health care provider determine how well your diabetes management plan is working. Blood glucose monitoring involves checking your blood glucose as often as directed, and keeping a record (log) of your results over time. Why should I monitor my blood glucose? Checking your blood glucose regularly can:  Help you understand how food, exercise, illnesses, and medicines affect your blood glucose.  Let you know what your blood glucose is at any time. You can quickly tell if you are having low blood glucose (hypoglycemia) or high blood glucose (hyperglycemia).  Help you and your health care provider adjust your medicines as needed.  When should I check my blood glucose? Follow instructions from your health care provider about how often to check your blood glucose. This may depend on:  The type of diabetes you have.  How well-controlled your diabetes is.  Medicines you are taking.  If you have type 1 diabetes:  Check your blood glucose at least 2 times a day.  Also check your blood glucose: ? Before every insulin injection. ? Before and after exercise. ? Between meals. ? 2 hours after a meal. ? Occasionally between 2:00 a.m. and 3:00 a.m., as directed. ? Before potentially dangerous tasks, like driving or using heavy machinery. ? At bedtime.  You may need to check your blood glucose more often, up to 6-10 times a day: ? If you use an insulin pump. ? If you need multiple daily injections (MDI). ? If your diabetes is not well-controlled. ? If you are ill. ? If you have a history of severe hypoglycemia. ? If you have a history of not knowing when your blood glucose is getting low (hypoglycemia unawareness). If you have type 2 diabetes:  If you take insulin or other diabetes medicines, check  your blood glucose at least 2 times a day.  If you are on intensive insulin therapy, check your blood glucose at least 4 times a day. Occasionally, you may also need to check between 2:00 a.m. and 3:00 a.m., as directed.  Also check your blood glucose: ? Before and after exercise. ? Before potentially dangerous tasks, like driving or using heavy machinery.  You may need to check your blood glucose more often if: ? Your medicine is being adjusted. ? Your diabetes is not well-controlled. ? You are ill. What is a blood glucose log?  A blood glucose log is a record of your blood glucose readings. It helps you and your health care provider: ? Look for patterns in your blood glucose over time. ? Adjust your diabetes management plan as needed.  Every time you check your blood glucose, write down your result and notes about things that may be affecting your blood glucose, such as your diet and exercise for the day.  Most glucose meters store a record of glucose readings in the meter. Some meters allow you to download your records to a computer. How do I check my blood glucose? Follow these steps to get accurate readings of your blood glucose: Supplies needed   Blood glucose meter.  Test strips for your meter. Each meter has its own strips. You must use the strips that come with your meter.  A needle to prick your finger (lancet). Do not use lancets more than once.  A device that holds the lancet (lancing  device).  A journal or log book to write down your results. Procedure  Wash your hands with soap and water.  Prick the side of your finger (not the tip) with the lancet. Use a different finger each time.  Gently rub the finger until a small drop of blood appears.  Follow instructions that come with your meter for inserting the test strip, applying blood to the strip, and using your blood glucose meter.  Write down your result and any notes. Alternative testing sites  Some  meters allow you to use areas of your body other than your finger (alternative sites) to test your blood.  If you think you may have hypoglycemia, or if you have hypoglycemia unawareness, do not use alternative sites. Use your finger instead.  Alternative sites may not be as accurate as the fingers, because blood flow is slower in these areas. This means that the result you get may be delayed, and it may be different from the result that you would get from your finger.  The most common alternative sites are: ? Forearm. ? Thigh. ? Palm of the hand. Additional tips  Always keep your supplies with you.  If you have questions or need help, all blood glucose meters have a 24-hour "hotline" number that you can call. You may also contact your health care provider.  After you use a few boxes of test strips, adjust (calibrate) your blood glucose meter by following instructions that came with your meter. This information is not intended to replace advice given to you by your health care provider. Make sure you discuss any questions you have with your health care provider. Document Released: 03/09/2003 Document Revised: 09/24/2015 Document Reviewed: 08/16/2015 Elsevier Interactive Patient Education  2017 [employer]

## 2017-06-02 LAB — BASIC METABOLIC PANEL
BUN / CREAT RATIO: 12 (ref 9–20)
BUN: 13 mg/dL (ref 6–20)
CHLORIDE: 103 mmol/L (ref 96–106)
CO2: 24 mmol/L (ref 20–29)
Calcium: 9.3 mg/dL (ref 8.7–10.2)
Creatinine, Ser: 1.07 mg/dL (ref 0.76–1.27)
GFR calc non Af Amer: 95 mL/min/{1.73_m2} (ref >59)
GFR, EST AFRICAN AMERICAN: 110 mL/min/{1.73_m2} (ref >59)
Glucose: 229 mg/dL — ABNORMAL HIGH (ref 65–99)
POTASSIUM: 4.9 mmol/L (ref 3.5–5.2)
SODIUM: 140 mmol/L (ref 134–144)

## 2017-06-02 LAB — HEMOGLOBIN A1C
ESTIMATED AVERAGE GLUCOSE: 223 mg/dL
HEMOGLOBIN A1C: 9.4 % — AB (ref 4.8–5.6)

## 2017-06-04 ENCOUNTER — Telehealth: Payer: Self-pay

## 2017-06-04 NOTE — Telephone Encounter (Signed)
Called, no answer. Left a message for patient to call back and left call back number. Thanks!  

## 2017-06-04 NOTE — Telephone Encounter (Signed)
-----   Message from LachinMassie Maroona M Hollis, [place] sent at 06/02/2017 11:31 AM EDT ----- Regarding: lab results Please inform patient that hemoglobin a1c is 9.4. Explain the importance of taking medications consistently and following a carbohydrate modified diet in order to achieve positive outcomes.    Thanks

## 2017-06-06 NOTE — Telephone Encounter (Signed)
Called, no answer. Left a message for patient to call back and left call back number. Thanks!  

## 2017-06-07 NOTE — Telephone Encounter (Signed)
Unable to reach patient by phone, I have mailed letter. Thanks!

## 2017-06-13 MED FILL — $LANTUS SOLOSTAR 100 UNITS/: 100 | 24 days supply | Qty: 6 | Fill #3

## 2017-06-13 MED FILL — ?HUMALOG 100 UNITS/ML VIAL: 100 | 22 days supply | Qty: 10 | Fill #3

## 2017-06-13 MED FILL — TRUE METRIX TEST STRIP: 30 days supply | Qty: 100 | Fill #3

## 2017-07-25 MED FILL — $Humalog 100u/ml vial: 100 | 22 days supply | Qty: 10 | Fill #4

## 2017-07-25 MED FILL — TRUE METRIX TEST STRIP: 30 days supply | Qty: 100 | Fill #4

## 2017-07-25 MED FILL — $LANTUS SOLOSTAR 100 UNITS/: 100 | 24 days supply | Qty: 6 | Fill #4

## 2017-08-22 MED FILL — TRUE METRIX TEST STRIP: 30 days supply | Qty: 100 | Fill #5

## 2017-08-22 MED FILL — $Humalog 100u/ml vial: 100 | 22 days supply | Qty: 10 | Fill #5

## 2017-08-22 MED FILL — $LANTUS SOLOSTAR 100 UNITS/: 100 | 24 days supply | Qty: 6 | Fill #5

## 2017-09-07 ENCOUNTER — Encounter: Payer: Self-pay | Admitting: Family Medicine

## 2017-09-07 ENCOUNTER — Ambulatory Visit (INDEPENDENT_AMBULATORY_CARE_PROVIDER_SITE_OTHER): Payer: Self-pay | Admitting: Family Medicine

## 2017-09-07 VITALS — BP 118/69 | HR 74 | Temp 98.8°F | Resp 16 | Ht 68.0 in | Wt 149.0 lb

## 2017-09-07 DIAGNOSIS — E109 Type 1 diabetes mellitus without complications: Secondary | ICD-10-CM

## 2017-09-07 LAB — POCT GLYCOSYLATED HEMOGLOBIN (HGB A1C): Hemoglobin A1C: 7.1 % — AB (ref 4.0–5.6)

## 2017-09-07 LAB — POCT URINALYSIS DIPSTICK
BILIRUBIN UA: NEGATIVE
Glucose, UA: NEGATIVE
KETONES UA: NEGATIVE
Leukocytes, UA: NEGATIVE
NITRITE UA: NEGATIVE
PH UA: 7 (ref 5.0–8.0)
Protein, UA: POSITIVE — AB
RBC UA: NEGATIVE
SPEC GRAV UA: 1.025 (ref 1.010–1.025)
UROBILINOGEN UA: 1 U/dL

## 2017-09-07 LAB — GLUCOSE, POCT (MANUAL RESULT ENTRY): POC Glucose: 84 mg/dl (ref 70–99)

## 2017-09-07 MED ORDER — INSULIN LISPRO 100 UNIT/ML CARTRIDGE: 0.0000 [IU] | Freq: Three times a day (TID) | SUBCUTANEOUS | 3 refills | Status: DC

## 2017-09-07 MED ORDER — INSULIN GLARGINE 100 UNIT/ML SOLOSTAR PEN: 25.0000 [IU] | PEN_INJECTOR | Freq: Every day | SUBCUTANEOUS | 3 refills | Status: DC

## 2017-09-07 MED ORDER — GLUCOSE BLOOD VI STRP: ORAL_STRIP | 11 refills | Status: DC

## 2017-09-07 MED FILL — TRUE METRIX GLUCOSE TEST ST: 25 days supply | Qty: 100 | Fill #0

## 2017-09-07 NOTE — Patient Instructions (Signed)
Type 1 Diabetes Mellitus, Self Care, Adult When you have type 1 diabetes (type 1 diabetes mellitus), you must keep your blood sugar (glucose) under control. You can do this with:  Insulin.  Nutrition.  Exercise.  Lifestyle changes.  Other medicines, if needed.  Support from your doctors and others.  How do I manage my blood sugar?  Check your blood sugar every day, as often as told.  Call your doctor if your blood sugar is above your goal numbers for 2 tests in a row.  Have your A1c (hemoglobin A1c) level checked at least twice a year. Have it checked more often if your doctor tells you to. Your doctor will set treatment goals for you. Generally, you should have these blood sugar levels:  Before meals (preprandial): 80-130 mg/dL (4.4-7.2 mmol/L).  After meals (postprandial): below 180 mg/dL (10 mmol/L).  A1c level: less than 7%.  What do I need to know about high blood sugar? High blood sugar is called hyperglycemia. Know the signs of high blood sugar. Signs may include:  Feeling: ? Thirsty. ? Hungry. ? Very tired.  Needing to pee (urinate) more than usual.  Blurry vision.  What do I need to know about low blood sugar? Low blood sugar is called hypoglycemia. This is when blood sugar is at or below 70 mg/dL (3.9 mmol/L). Symptoms may include:  Feeling: ? Hungry. ? Worried or nervous (anxious). ? Sweaty and clammy. ? Confused. ? Dizzy. ? Sleepy. ? Sick to your stomach (nauseous).  Having: ? A fast heartbeat. ? A headache. ? A change in your vision. ? Jerky movements that you cannot control (seizure). ? Nightmares. ? Tingling or no feeling (numbness) around the mouth, lips, or tongue.  Having trouble with: ? Talking. ? Paying attention (concentrating). ? Moving (coordination). ? Sleeping.  Shaking.  Passing out (fainting).  Getting upset easily (irritability).  Treating low blood sugar  To treat low blood sugar, eat or drink something sugary  right away. If you can think clearly and swallow safely, follow the 15:15 rule:  Take 15 grams of a fast-acting carb (carbohydrate). Some fast-acting carbs are: ? 1 tube of glucose gel. ? 3 sugar tablets (glucose pills). ? 6-8 pieces of hard candy. ? 4 oz (120 mL) of fruit juice. ? 4 oz (120 mL) regular (not diet) soda.  Check your blood sugar 15 minutes after you take the carb.  If your blood sugar is still at or below 70 mg/dL (3.9 mmol/L), take 15 grams of a carb again.  If your blood sugar does not go above 70 mg/dL (3.9 mmol/L) after 3 tries, get help right away.  After your blood sugar goes back to normal, eat a meal or a snack within 1 hour.  Treating very low blood sugar If your blood sugar is at or below 54 mg/dL (3 mmol/L), you have very low blood sugar (severe hypoglycemia). This is an emergency. Do not wait to see if the symptoms will go away. Get medical help right away. Call your local emergency services (911 in the [place] Do not drive yourself to the hospital. If you have very low blood sugar and you cannot eat or drink, you may need a glucagon shot (injection). A family member or friend should learn how to check your blood sugar and how to give you a glucagon shot. Ask your doctor if you need to have a glucagon shot kit at home. What else is important to manage my diabetes? Medicine  Take insulin  and diabetes medicines as told.  Adjust your insulin and medicines as told.  Do not run out of insulin or medicines. Having diabetes can put you at risk for other long-term (chronic) conditions. These may include heart disease and kidney disease. Your doctor may prescribe medicines to help prevent problems from diabetes. Food  Make healthy food choices. These include: ? Chicken, fish, egg whites, and beans. ? Oats, whole wheat, bulgur, brown rice, quinoa, and millet. ? Fresh fruits and vegetables. ? Low-fat dairy products. ? Nuts, avocado, olive oil, and canola  oil.  Meet with a food specialist (registered dietitian). He or she can help you make an eating plan that is right for you.  Follow instructions from your doctor about what you cannot eat or drink.  Drink enough fluid to keep your pee (urine) clear or pale yellow.  Eat healthy snacks between healthy meals.  Keep track of carbs that you eat. Do this by reading food labels and learning food serving sizes.  Follow your sick day plan when you cannot eat or drink normally. Make this plan with your doctor so it is ready to use. Activity   Exercise at least 3 times a week.  Do not go more than 2 days without exercising.  Talk with your doctor before you start a new exercise. Your doctor may need to adjust your insulin, medicines, or food. Lifestyle   Do not use any tobacco products. These include cigarettes, chewing tobacco, and e-cigarettes. If you need help quitting, ask your doctor.  Ask your doctor how much alcohol is safe for you.  Learn to deal with stress. If you need help with this, ask your doctor. Body care  Stay up to date with your shots (immunizations).  Have your eyes and feet checked by a doctor as often as told.  Check your skin and feet every day. Check for cuts, bruises, redness, blisters, or sores.  Brush your teeth and gums two times a day, and floss at least one time a day.  Go to the dentist least one time every 6 months.  Stay at a healthy weight. General instructions   Take over-the-counter and prescription medicines only as told by your doctor.  Share your diabetes care plan with: ? Your work or school. ? People you live with.  Check your pee (urine) for ketones: ? When you are sick. ? As told by your doctor.  Carry a card or wear jewelry that says that you have diabetes.  Ask your doctor: ? Do I need to meet with a diabetes educator? ? Where can I find a support group for people with diabetes?  Keep all follow-up visits as told by your  doctor. This is important. Where to find more information: To learn more about diabetes, visit:  American Diabetes Association: www.diabetes.org  American Association of Diabetes Educators: www.diabeteseducator.org/patient-resources  This information is not intended to replace advice given to you by your health care provider. Make sure you discuss any questions you have with your health care provider. Document Released: 06/28/2015 Document Revised: 08/12/2015 Document Reviewed: 04/09/2015 Elsevier Interactive Patient Education  [employer].

## 2017-09-07 NOTE — Progress Notes (Signed)
Subjective:    Patient ID: Glenn Ball, male    DOB: 27-Apr-1990, 27 y.o.   MRN: [id number]  Noris Kulinski, a 27 year old male with a history of type 1 diabetes mellitus. He has been taking Lantus 25 units consistently and Humalog per sliding scale. He says that he was diagnosed with type 1 diabetes at age 66. Joshuan has been checking blood sugar consistently. His clinical course has fluctuated. Associated symptoms of hyperglycemia have been excessive thirst and polyuria.  Blood sugar 84 on arrival, he says that he went out drinking alcohol on last night. He says that he did not eat or take insulin this am.   Patient denies foot ulcerations, hyperglycemia, hypoglycemia , nausea, paresthesia of the feet, visual disturbances, vomitting and weight loss.   Diabetes  He has type 1 diabetes mellitus. His disease course has been stable. Pertinent negatives for diabetes include no chest pain, no fatigue, no polydipsia, no polyphagia and no polyuria. Pertinent negatives for hypoglycemia complications include no blackouts, no hospitalization, no nocturnal hypoglycemia, no required assistance and no required glucagon injection. His weight is stable. He is following a diabetic diet. Meal planning includes carbohydrate counting. He has not had a previous visit with a dietitian. His breakfast blood glucose range is generally >200 mg/dl. His lunch blood glucose range is generally 140-180 mg/dl. His bedtime blood glucose range is generally 130-140 mg/dl. An ACE inhibitor/angiotensin II receptor blocker is not being taken. He does not see a podiatrist.Eye exam is current.   Past Medical History:  Diagnosis Date  . Depression   . Diabetes mellitus    Social History   Socioeconomic History  . Marital status: Single    Spouse name: Not on file  . Number of children: Not on file  . Years of education: Not on file  . Highest education level: Not on file  Occupational History  . Not on file  Social Needs  .  Financial resource strain: Not on file  . Food insecurity:    Worry: Not on file    Inability: Not on file  . Transportation needs:    Medical: Not on file    Non-medical: Not on file  Tobacco Use  . Smoking status: Former Games developer  . Smokeless tobacco: Never Used  Substance and Sexual Activity  . Alcohol use: Yes    Comment: occ  . Drug use: Yes    Types: Marijuana    Comment: 1-2 times a month. occ  . Sexual activity: Yes  Lifestyle  . Physical activity:    Days per week: Not on file    Minutes per session: Not on file  . Stress: Not on file  Relationships  . Social connections:    Talks on phone: Not on file    Gets together: Not on file    Attends religious service: Not on file    Active member of club or organization: Not on file    Attends meetings of clubs or organizations: Not on file    Relationship status: Not on file  . Intimate partner violence:    Fear of current or ex partner: Not on file    Emotionally abused: Not on file    Physically abused: Not on file    Forced sexual activity: Not on file  Other Topics Concern  . Not on file  Social History Narrative  . Not on file   Allergies  Allergen Reactions  . Sulfa Antibiotics     "just  knows he's allergic"   Immunization History  Administered Date(s) Administered  . Pneumococcal Polysaccharide-23 10/25/2016  . Tdap 10/25/2016   Diabetic Foot Exam - Simple   No data filed      Review of Systems  Constitutional: Negative for fatigue.  HENT: Negative.   Eyes: Negative for photophobia.  Respiratory: Negative.   Cardiovascular: Negative for chest pain and leg swelling.  Gastrointestinal: Negative.   Endocrine: Negative for polydipsia, polyphagia and polyuria.  Genitourinary: Positive for dysuria.  Musculoskeletal: Negative.   Skin: Negative.   Neurological: Negative.   Hematological: Negative.   Psychiatric/Behavioral: Negative.        Objective:   Physical Exam  Constitutional: He is  oriented to person, place, and time.  HENT:  Head: Normocephalic and atraumatic.  Right Ear: External ear normal.  Left Ear: External ear normal.  Nose: Nose normal.  Mouth/Throat: Oropharynx is clear and moist.  Eyes: Pupils are equal, round, and reactive to light. Conjunctivae and EOM are normal.  Neck: Normal range of motion. Neck supple.  Cardiovascular: Normal rate, regular rhythm, normal heart sounds and intact distal pulses.  Pulmonary/Chest: Effort normal and breath sounds normal.  Abdominal: Soft. Bowel sounds are normal.  Musculoskeletal: Normal range of motion.  Neurological: He is alert and oriented to person, place, and time. He has normal reflexes.  Skin: Skin is warm and dry.  Psychiatric: He has a normal mood and affect. His behavior is normal. Judgment and thought content normal.  There were no vitals taken for this visit.    Assessment & Plan:  Type 1 diabetes mellitus without complication (HCC) Hemoglobin A1c has decreased to 7.1.  Patient states that he has been taking medications consistently.  Patient's  A1C goal is less than 7. Your fasting blood sugar  Upon awakening goal is between 110-140.   Blood pressure goal is <140/90.  Recommend a lowfat, low carbohydrate diet divided over 5-6 small meals, increase water intake to 6-8 glasses, and 150 minutes per week of cardiovascular exercise.   Take your medications as prescribed Make sure that you are familiar with each one of your medications and what they are used to treat.  If you are unsure of medications, please bring to follow up Will send referral for eye exam  Please keep your scheduled follow up appointment.  - HgB A1c - Urinalysis Dipstick - Glucose (CBG) - Basic Metabolic Panel - Insulin Glargine (LANTUS SOLOSTAR) 100 UNIT/ML Solostar Pen; Inject 25 Units into the skin daily at 10 pm.  Dispense: 15 mL; Refill: 3 - insulin lispro (HUMALOG) 100 UNIT/ML cartridge; Inject 0-0.15 mLs (0-15 Units total) into  the skin 3 (three) times daily before meals.  Dispense: 30 mL; Refill: 3 - glucose blood (TRUE METRIX BLOOD GLUCOSE TEST) test strip; Check blood sugars 3 times per day prior to meals and at bedtime. Use as instructed  Dispense: 100 each; Refill: 11  RTC: 3 months for type DM1  The patient was given clear instructions to go to ER or return to medical center if symptoms do not improve, worsen or new problems develop. The patient verbalized understanding. Will notify patient with laboratory results.   Nolon NationsLachina Moore Dymond Gutt  MSN, FNP-C [facility] [address]  [place], [place] [id number] [phone]

## 2017-09-08 LAB — BASIC METABOLIC PANEL
BUN / CREAT RATIO: 10 (ref 9–20)
BUN: 11 mg/dL (ref 6–20)
CO2: 27 mmol/L (ref 20–29)
CREATININE: 1.14 mg/dL (ref 0.76–1.27)
Calcium: 9.9 mg/dL (ref 8.7–10.2)
Chloride: 103 mmol/L (ref 96–106)
GFR calc non Af Amer: 88 mL/min/{1.73_m2} (ref >59)
GFR, EST AFRICAN AMERICAN: 101 mL/min/{1.73_m2} (ref >59)
Glucose: 62 mg/dL — ABNORMAL LOW (ref 65–99)
Potassium: 4.8 mmol/L (ref 3.5–5.2)
Sodium: 143 mmol/L (ref 134–144)

## 2017-09-12 MED FILL — !HUMALOG 100 UNITS/ML VIAL: 100 | 22 days supply | Qty: 10 | Fill #6

## 2017-09-12 MED FILL — $LANTUS SOLOSTAR 100 UNITS/: 100 | 24 days supply | Qty: 6 | Fill #6

## 2017-09-27 MED FILL — TRUE METRIX TEST STRIP: 30 days supply | Qty: 100 | Fill #6

## 2017-10-04 MED FILL — !HUMALOG 100 UNITS/ML VIAL: 100 | 22 days supply | Qty: 10 | Fill #7

## 2017-10-04 MED FILL — $LANTUS SOLOSTAR 100 UNITS/: 100 | 24 days supply | Qty: 6 | Fill #7

## 2017-11-01 MED FILL — TRUE METRIX GLUCOSE TEST ST: 25 days supply | Qty: 100 | Fill #1

## 2017-11-01 MED FILL — !HUMALOG 100 UNITS/ML VIAL: 100 | 22 days supply | Qty: 10 | Fill #8

## 2017-11-01 MED FILL — $LANTUS SOLOSTAR 100 UNITS/: 100 | 24 days supply | Qty: 6 | Fill #8

## 2017-11-26 MED FILL — TRUE METRIX TEST STRIP: 30 days supply | Qty: 100 | Fill #7

## 2017-11-26 MED FILL — !HUMALOG 100 UNITS/ML VIAL: 100 | 22 days supply | Qty: 10 | Fill #9

## 2017-11-26 MED FILL — $LANTUS SOLOSTAR 100 UNITS/: 100 | 24 days supply | Qty: 6 | Fill #9

## 2017-12-07 ENCOUNTER — Ambulatory Visit: Payer: Self-pay | Admitting: Family Medicine

## 2017-12-19 MED FILL — TRUE METRIX TEST STRIP: 30 days supply | Qty: 100 | Fill #8

## 2017-12-27 ENCOUNTER — Telehealth: Payer: Self-pay

## 2017-12-27 NOTE — Telephone Encounter (Signed)
Left a vm to let patient know that his appointment is tomorrow at 1020am and to give [place] a call back if he needs to reschedule

## 2017-12-28 ENCOUNTER — Ambulatory Visit (INDEPENDENT_AMBULATORY_CARE_PROVIDER_SITE_OTHER): Payer: Self-pay | Admitting: Family Medicine

## 2017-12-28 ENCOUNTER — Encounter: Payer: Self-pay | Admitting: Family Medicine

## 2017-12-28 VITALS — BP 121/77 | HR 92 | Temp 98.8°F | Resp 16 | Ht 68.0 in | Wt 154.0 lb

## 2017-12-28 DIAGNOSIS — E109 Type 1 diabetes mellitus without complications: Secondary | ICD-10-CM

## 2017-12-28 DIAGNOSIS — Z23 Encounter for immunization: Secondary | ICD-10-CM

## 2017-12-28 LAB — POCT GLYCOSYLATED HEMOGLOBIN (HGB A1C): Hemoglobin A1C: 7.1 % — AB (ref 4.0–5.6)

## 2017-12-28 LAB — GLUCOSE, POCT (MANUAL RESULT ENTRY): POC Glucose: 270 mg/dL — AB (ref 70–99)

## 2017-12-28 MED ORDER — INSULIN LISPRO 100 UNIT/ML CARTRIDGE: 0.0000 [IU] | Freq: Three times a day (TID) | SUBCUTANEOUS | 3 refills | Status: DC

## 2017-12-28 MED ORDER — INSULIN GLARGINE 100 UNIT/ML SOLOSTAR PEN: 25.0000 [IU] | PEN_INJECTOR | Freq: Every day | SUBCUTANEOUS | 3 refills | Status: DC

## 2017-12-28 MED FILL — !LANTUS SOLOSTAR 100UNITS/M: 100 | 24 days supply | Qty: 6 | Fill #0

## 2017-12-28 MED FILL — ?HUMALOG 100 UNITS/ML VIAL: 100 | 22 days supply | Qty: 10 | Fill #0

## 2017-12-28 NOTE — Patient Instructions (Signed)
It was a pleasure meeting you. I have sent your medications to the pharmacy. I will see you in 3 months.    Type 1 Diabetes Mellitus, Self Care, Adult When you have type 1 diabetes (type 1 diabetes mellitus), you must keep your blood sugar (glucose) under control. You can do this with:  Insulin.  Nutrition.  Exercise.  Lifestyle changes.  Other medicines, if needed.  Support from your doctors and others.  How do I manage my blood sugar?  Check your blood sugar every day, as often as told.  Call your doctor if your blood sugar is above your goal numbers for 2 tests in a row.  Have your A1c (hemoglobin A1c) level checked at least twice a year. Have it checked more often if your doctor tells you to. Your doctor will set treatment goals for you. Generally, you should have these blood sugar levels:  Before meals (preprandial): 80-130 mg/dL (4.4-7.2 mmol/L).  After meals (postprandial): below 180 mg/dL (10 mmol/L).  A1c level: less than 7%.  What do I need to know about high blood sugar? High blood sugar is called hyperglycemia. Know the signs of high blood sugar. Signs may include:  Feeling: ? Thirsty. ? Hungry. ? Very tired.  Needing to pee (urinate) more than usual.  Blurry vision.  What do I need to know about low blood sugar? Low blood sugar is called hypoglycemia. This is when blood sugar is at or below 70 mg/dL (3.9 mmol/L). Symptoms may include:  Feeling: ? Hungry. ? Worried or nervous (anxious). ? Sweaty and clammy. ? Confused. ? Dizzy. ? Sleepy. ? Sick to your stomach (nauseous).  Having: ? A fast heartbeat. ? A headache. ? A change in your vision. ? Jerky movements that you cannot control (seizure). ? Nightmares. ? Tingling or no feeling (numbness) around the mouth, lips, or tongue.  Having trouble with: ? Talking. ? Paying attention (concentrating). ? Moving (coordination). ? Sleeping.  Shaking.  Passing out (fainting).  Getting upset  easily (irritability).  Treating low blood sugar  To treat low blood sugar, eat or drink something sugary right away. If you can think clearly and swallow safely, follow the 15:15 rule:  Take 15 grams of a fast-acting carb (carbohydrate). Some fast-acting carbs are: ? 1 tube of glucose gel. ? 3 sugar tablets (glucose pills). ? 6-8 pieces of hard candy. ? 4 oz (120 mL) of fruit juice. ? 4 oz (120 mL) regular (not diet) soda.  Check your blood sugar 15 minutes after you take the carb.  If your blood sugar is still at or below 70 mg/dL (3.9 mmol/L), take 15 grams of a carb again.  If your blood sugar does not go above 70 mg/dL (3.9 mmol/L) after 3 tries, get help right away.  After your blood sugar goes back to normal, eat a meal or a snack within 1 hour.  Treating very low blood sugar If your blood sugar is at or below 54 mg/dL (3 mmol/L), you have very low blood sugar (severe hypoglycemia). This is an emergency. Do not wait to see if the symptoms will go away. Get medical help right away. Call your local emergency services (911 in the [place] Do not drive yourself to the hospital. If you have very low blood sugar and you cannot eat or drink, you may need a glucagon shot (injection). A family member or friend should learn how to check your blood sugar and how to give you a glucagon shot. Ask your  doctor if you need to have a glucagon shot kit at home. What else is important to manage my diabetes? Medicine  Take insulin and diabetes medicines as told.  Adjust your insulin and medicines as told.  Do not run out of insulin or medicines. Having diabetes can put you at risk for other long-term (chronic) conditions. These may include heart disease and kidney disease. Your doctor may prescribe medicines to help prevent problems from diabetes. Food  Make healthy food choices. These include: ? Chicken, fish, egg whites, and beans. ? Oats, whole wheat, bulgur, brown rice, quinoa, and  millet. ? Fresh fruits and vegetables. ? Low-fat dairy products. ? Nuts, avocado, olive oil, and canola oil.  Meet with a food specialist (registered dietitian). He or she can help you make an eating plan that is right for you.  Follow instructions from your doctor about what you cannot eat or drink.  Drink enough fluid to keep your pee (urine) clear or pale yellow.  Eat healthy snacks between healthy meals.  Keep track of carbs that you eat. Do this by reading food labels and learning food serving sizes.  Follow your sick day plan when you cannot eat or drink normally. Make this plan with your doctor so it is ready to use. Activity   Exercise at least 3 times a week.  Do not go more than 2 days without exercising.  Talk with your doctor before you start a new exercise. Your doctor may need to adjust your insulin, medicines, or food. Lifestyle   Do not use any tobacco products. These include cigarettes, chewing tobacco, and e-cigarettes. If you need help quitting, ask your doctor.  Ask your doctor how much alcohol is safe for you.  Learn to deal with stress. If you need help with this, ask your doctor. Body care  Stay up to date with your shots (immunizations).  Have your eyes and feet checked by a doctor as often as told.  Check your skin and feet every day. Check for cuts, bruises, redness, blisters, or sores.  Brush your teeth and gums two times a day, and floss at least one time a day.  Go to the dentist least one time every 6 months.  Stay at a healthy weight. General instructions   Take over-the-counter and prescription medicines only as told by your doctor.  Share your diabetes care plan with: ? Your work or school. ? People you live with.  Check your pee (urine) for ketones: ? When you are sick. ? As told by your doctor.  Carry a card or wear jewelry that says that you have diabetes.  Ask your doctor: ? Do I need to meet with a diabetes  educator? ? Where can I find a support group for people with diabetes?  Keep all follow-up visits as told by your doctor. This is important. Where to find more information: To learn more about diabetes, visit:  American Diabetes Association: www.diabetes.org  American Association of Diabetes Educators: www.diabeteseducator.org/patient-resources  This information is not intended to replace advice given to you by your health care provider. Make sure you discuss any questions you have with your health care provider. Document Released: 06/28/2015 Document Revised: 08/12/2015 Document Reviewed: 04/09/2015 Elsevier Interactive Patient Education  [employer].

## 2017-12-28 NOTE — Progress Notes (Signed)
[facility]   Progress Note: General Provider: Mike Gip, FNP  SUBJECTIVE:   Glenn Ball is a 27 y.o. male who  has a past medical history of Depression and Diabetes mellitus.. Patient presents today for Diabetes Patient states that that he is a Type 1 diabetic. He states that he has increased amount of fast food in the past few months due to time, and money. Patient has had 1-2 episodes over the past few months. Patient reports blood sugars in the 100 fasting. No problems or concerns today.  Review of Systems  Constitutional: Negative.   HENT: Negative.   Eyes: Negative.   Respiratory: Negative.   Cardiovascular: Negative.   Gastrointestinal: Negative.   Genitourinary: Negative.   Musculoskeletal: Negative.   Skin: Negative.   Neurological: Negative.   Psychiatric/Behavioral: Negative.      OBJECTIVE: BP 121/77 (BP Location: Right Arm, Patient Position: Sitting, Cuff Size: Normal)   Pulse 92   Temp 98.8 F (37.1 C) (Oral)   Resp 16   Ht 5\' 8"  (1.727 m)   Wt 154 lb (69.9 kg)   SpO2 98%   BMI 23.42 kg/m   Physical Exam  Constitutional: He is oriented to person, place, and time. He appears well-developed and well-nourished. No distress.  HENT:  Head: Normocephalic and atraumatic.  Eyes: Pupils are equal, round, and reactive to light. Conjunctivae and EOM are normal.  Neck: Normal range of motion.  Cardiovascular: Normal rate, regular rhythm, normal heart sounds and intact distal pulses.  Pulses:      Dorsalis pedis pulses are 2+ on the right side, and 2+ on the left side.       Posterior tibial pulses are 2+ on the right side, and 2+ on the left side.  Pulmonary/Chest: Effort normal and breath sounds normal. No respiratory distress.  Abdominal: Soft. Bowel sounds are normal. He exhibits no distension.  Musculoskeletal: Normal range of motion.       Right foot: There is normal range of motion and no deformity.   Left foot: There is no deformity.  Feet:  Right Foot:  Protective Sensation: 10 sites tested. 10 sites sensed.  Skin Integrity: Negative for ulcer, blister, skin breakdown or callus.  Left Foot:  Protective Sensation: 10 sites tested. 10 sites sensed.  Skin Integrity: Negative for ulcer, blister, skin breakdown or callus.  Neurological: He is alert and oriented to person, place, and time.  Skin: Skin is warm and dry.  Psychiatric: He has a normal mood and affect. His behavior is normal. Thought content normal.  Nursing note and vitals reviewed.   ASSESSMENT/PLAN:   1. Type 1 diabetes mellitus without complication (HCC) The current medical regimen is effective;  continue present plan and medications.Discussed diet and exercise, limiting carbs and increasing protein.  - Glucose (CBG) - HgB A1c - Urinalysis, Routine w reflex microscopic - insulin lispro (HUMALOG) 100 UNIT/ML cartridge; Inject 0-0.15 mLs (0-15 Units total) into the skin 3 (three) times daily before meals.  Dispense: 30 mL; Refill: 3 - Insulin Glargine (LANTUS SOLOSTAR) 100 UNIT/ML Solostar Pen; Inject 25 Units into the skin daily at 10 pm.  Dispense: 15 mL; Refill: 3        The patient was given clear instructions to go to ER or return to medical center if symptoms do not improve, worsen or new problems develop. The patient verbalized understanding and agreed with plan of care.   Ms. Freda Jackson. Riley Lam, FNP-BC [facility] [address] [place], [place] [address] [phone]     This note has been created with Dragon speech recognition software and smart phrase technology. Any transcriptional errors are unintentional.

## 2017-12-29 LAB — MICROSCOPIC EXAMINATION
Bacteria, UA: NONE SEEN
Casts: NONE SEEN /lpf
Epithelial Cells (non renal): NONE SEEN /hpf (ref 0–10)

## 2017-12-29 LAB — URINALYSIS, ROUTINE W REFLEX MICROSCOPIC
Bilirubin, UA: NEGATIVE
Ketones, UA: NEGATIVE
Leukocytes, UA: NEGATIVE
Nitrite, UA: NEGATIVE
RBC, UA: NEGATIVE
Specific Gravity, UA: >=1.03 — AB (ref 1.005–1.030)
Urobilinogen, Ur: 1 mg/dL (ref 0.2–1.0)
pH, UA: 6.5 (ref 5.0–7.5)

## 2017-12-29 LAB — MICROALBUMIN, URINE: Microalbumin, Urine: 63.1 ug/mL

## 2018-01-13 ENCOUNTER — Encounter: Payer: Self-pay | Admitting: Family Medicine

## 2018-01-13 NOTE — Progress Notes (Signed)
Please print this lab letter and send to [facility]

## 2018-01-17 MED FILL — TRUE METRIX TEST STRIP: 30 days supply | Qty: 100 | Fill #9

## 2018-01-17 MED FILL — !LANTUS SOLOSTAR 100UNITS/M: 100 | 24 days supply | Qty: 6 | Fill #1

## 2018-01-18 MED FILL — ?HUMALOG 100 UNITS/ML VIAL: 100 | 22 days supply | Qty: 10 | Fill #1

## 2018-02-22 MED FILL — TRUE METRIX TEST STRIP: 30 days supply | Qty: 100 | Fill #10

## 2018-02-22 MED FILL — !HUMALOG 100 UNITS/ML VIAL: 100 | 22 days supply | Qty: 10 | Fill #2

## 2018-02-22 MED FILL — !LANTUS SOLOSTAR 100UNITS/M: 100 | 24 days supply | Qty: 6 | Fill #2

## 2018-03-19 ENCOUNTER — Other Ambulatory Visit: Payer: Self-pay

## 2018-03-19 MED ORDER — TRUEPLUS LANCETS 28G MISC: 1.0000 | Freq: Four times a day (QID) | 11 refills | Status: AC

## 2018-03-22 MED FILL — !LANTUS SOLOSTAR 100UNITS/M: 100 | 24 days supply | Qty: 6 | Fill #3

## 2018-03-29 ENCOUNTER — Ambulatory Visit: Payer: Self-pay | Admitting: Family Medicine

## 2018-04-10 MED FILL — !HUMALOG 100 UNITS/ML VIAL: 100 | 22 days supply | Qty: 10 | Fill #4

## 2018-04-10 MED FILL — !LANTUS SOLOSTAR 100UNITS/M: 100 | 24 days supply | Qty: 6 | Fill #4

## 2018-04-10 MED FILL — TRUE METRIX TEST STRIP: 25 days supply | Qty: 100 | Fill #3

## 2018-04-19 ENCOUNTER — Encounter: Payer: Self-pay | Admitting: Family Medicine

## 2018-04-19 ENCOUNTER — Ambulatory Visit (INDEPENDENT_AMBULATORY_CARE_PROVIDER_SITE_OTHER): Payer: Self-pay | Admitting: Family Medicine

## 2018-04-19 VITALS — BP 108/69 | HR 86 | Temp 97.9°F | Resp 14 | Ht 68.0 in | Wt 150.0 lb

## 2018-04-19 DIAGNOSIS — E109 Type 1 diabetes mellitus without complications: Secondary | ICD-10-CM

## 2018-04-19 DIAGNOSIS — Z113 Encounter for screening for infections with a predominantly sexual mode of transmission: Secondary | ICD-10-CM

## 2018-04-19 LAB — POCT URINALYSIS DIPSTICK
Glucose, UA: POSITIVE — AB
Ketones, UA: NEGATIVE
Leukocytes, UA: NEGATIVE
Nitrite, UA: NEGATIVE
Protein, UA: POSITIVE — AB
Spec Grav, UA: >=1.03 — AB (ref 1.010–1.025)
Urobilinogen, UA: 1 E.U./dL
pH, UA: 6 (ref 5.0–8.0)

## 2018-04-19 LAB — POCT GLYCOSYLATED HEMOGLOBIN (HGB A1C): Hemoglobin A1C: 7.3 % — AB (ref 4.0–5.6)

## 2018-04-19 LAB — GLUCOSE, POCT (MANUAL RESULT ENTRY)
POC Glucose: 68 mg/dl — AB (ref 70–99)
POC Glucose: 43 mg/dl — AB (ref 70–99)
POC Glucose: 94 mg/dl (ref 70–99)

## 2018-04-19 MED ORDER — GLUCOSE BLOOD VI STRP: ORAL_STRIP | 11 refills | Status: DC

## 2018-04-19 NOTE — Patient Instructions (Signed)
Diabetes Mellitus and Nutrition, Adult  When you have diabetes (diabetes mellitus), it is very important to have healthy eating habits because your blood sugar (glucose) levels are greatly affected by what you eat and drink. Eating healthy foods in the appropriate amounts, at about the same times every day, can help you:  · Control your blood glucose.  · Lower your risk of heart disease.  · Improve your blood pressure.  · Reach or maintain a healthy weight.  Every person with diabetes is different, and each person has different needs for a meal plan. Your health care provider may recommend that you work with a diet and nutrition specialist (dietitian) to make a meal plan that is best for you. Your meal plan may vary depending on factors such as:  · The calories you need.  · The medicines you take.  · Your weight.  · Your blood glucose, blood pressure, and cholesterol levels.  · Your activity level.  · Other health conditions you have, such as heart or kidney disease.  How do carbohydrates affect me?  Carbohydrates, also called carbs, affect your blood glucose level more than any other type of food. Eating carbs naturally raises the amount of glucose in your blood. Carb counting is a method for keeping track of how many carbs you eat. Counting carbs is important to keep your blood glucose at a healthy level, especially if you use insulin or take certain oral diabetes medicines.  It is important to know how many carbs you can safely have in each meal. This is different for every person. Your dietitian can help you calculate how many carbs you should have at each meal and for each snack.  Foods that contain carbs include:  · Bread, cereal, rice, pasta, and crackers.  · Potatoes and corn.  · Peas, beans, and lentils.  · Milk and yogurt.  · Fruit and juice.  · Desserts, such as cakes, cookies, ice cream, and candy.  How does alcohol affect me?  Alcohol can cause a sudden decrease in blood glucose (hypoglycemia),  especially if you use insulin or take certain oral diabetes medicines. Hypoglycemia can be a life-threatening condition. Symptoms of hypoglycemia (sleepiness, dizziness, and confusion) are similar to symptoms of having too much alcohol.  If your health care provider says that alcohol is safe for you, follow these guidelines:  · Limit alcohol intake to no more than 1 drink per day for nonpregnant women and 2 drinks per day for men. One drink equals 12 oz of beer, 5 oz of wine, or 1½ oz of hard liquor.  · Do not drink on an empty stomach.  · Keep yourself hydrated with water, diet soda, or unsweetened iced tea.  · Keep in mind that regular soda, juice, and other mixers may contain a lot of sugar and must be counted as carbs.  What are tips for following this plan?    Reading food labels  · Start by checking the serving size on the "Nutrition Facts" label of packaged foods and drinks. The amount of calories, carbs, fats, and other nutrients listed on the label is based on one serving of the item. Many items contain more than one serving per package.  · Check the total grams (g) of carbs in one serving. You can calculate the number of servings of carbs in one serving by dividing the total carbs by 15. For example, if a food has 30 g of total carbs, it would be equal to 2   servings of carbs.  · Check the number of grams (g) of saturated and trans fats in one serving. Choose foods that have low or no amount of these fats.  · Check the number of milligrams (mg) of salt (sodium) in one serving. Most people should limit total sodium intake to less than 2,300 mg per day.  · Always check the nutrition information of foods labeled as "low-fat" or "nonfat". These foods may be higher in added sugar or refined carbs and should be avoided.  · Talk to your dietitian to identify your daily goals for nutrients listed on the label.  Shopping  · Avoid buying canned, premade, or processed foods. These foods tend to be high in fat, sodium,  and added sugar.  · Shop around the outside edge of the grocery store. This includes fresh fruits and vegetables, bulk grains, fresh meats, and fresh dairy.  Cooking  · Use low-heat cooking methods, such as baking, instead of high-heat cooking methods like deep frying.  · Cook using healthy oils, such as olive, canola, or sunflower oil.  · Avoid cooking with butter, cream, or high-fat meats.  Meal planning  · Eat meals and snacks regularly, preferably at the same times every day. Avoid going long periods of time without eating.  · Eat foods high in fiber, such as fresh fruits, vegetables, beans, and whole grains. Talk to your dietitian about how many servings of carbs you can eat at each meal.  · Eat 4-6 ounces (oz) of lean protein each day, such as lean meat, chicken, fish, eggs, or tofu. One oz of lean protein is equal to:  ? 1 oz of meat, chicken, or fish.  ? 1 egg.  ? ¼ cup of tofu.  · Eat some foods each day that contain healthy fats, such as avocado, nuts, seeds, and fish.  Lifestyle  · Check your blood glucose regularly.  · Exercise regularly as told by your health care provider. This may include:  ? 150 minutes of moderate-intensity or vigorous-intensity exercise each week. This could be brisk walking, biking, or water aerobics.  ? Stretching and doing strength exercises, such as yoga or weightlifting, at least 2 times a week.  · Take medicines as told by your health care provider.  · Do not use any products that contain nicotine or tobacco, such as cigarettes and e-cigarettes. If you need help quitting, ask your health care provider.  · Work with a counselor or diabetes educator to identify strategies to manage stress and any emotional and social challenges.  Questions to ask a health care provider  · Do I need to meet with a diabetes educator?  · Do I need to meet with a dietitian?  · What number can I call if I have questions?  · When are the best times to check my blood glucose?  Where to find more  information:  · American Diabetes Association: diabetes.org  · Academy of Nutrition and Dietetics: www.eatright.org  · National Institute of Diabetes and Digestive and Kidney Diseases (NIH): www.niddk.nih.gov  Summary  · A healthy meal plan will help you control your blood glucose and maintain a healthy lifestyle.  · Working with a diet and nutrition specialist (dietitian) can help you make a meal plan that is best for you.  · Keep in mind that carbohydrates (carbs) and alcohol have immediate effects on your blood glucose levels. It is important to count carbs and to use alcohol carefully.  This information is not intended to   replace advice given to you by your health care provider. Make sure you discuss any questions you have with your health care provider.  Document Released: 12/01/2004 Document Revised: 10/04/2016 Document Reviewed: 04/10/2016  Elsevier Interactive Patient Education © 2019 Elsevier Inc.

## 2018-04-19 NOTE — Progress Notes (Signed)
[facility]   Progress Note: General Provider: Mike Gip, FNP  SUBJECTIVE:   Glenn Ball is a 28 y.o. male who  has a past medical history of Depression and Diabetes mellitus.. Patient presents today for Diabetes  Patient states that he had an increase in CBG at 500. Patient states that he took the regular amount of lantus at 25 units. Noticed that his blood sugar was high and took 30units of humalog "because they give me large amounts at the ED if I go".  Patient would like to have STI testing. He denies symptoms at the present time. Patient reports 5 or more male sexual partner with inconsistent condom use in the past 3 months.   Review of Systems  Constitutional: Negative.   HENT: Negative.   Eyes: Negative.   Respiratory: Negative.   Cardiovascular: Negative.   Gastrointestinal: Negative.   Genitourinary: Negative.   Musculoskeletal: Negative.   Skin: Negative.   Neurological: Negative.   Psychiatric/Behavioral: Negative.      OBJECTIVE: BP 108/69 (BP Location: Left Arm, Patient Position: Sitting, Cuff Size: Normal)   Pulse 86   Temp 97.9 F (36.6 C) (Oral)   Resp 14   Ht 5\' 8"  (1.727 m)   Wt 150 lb (68 kg)   SpO2 98%   BMI 22.81 kg/m   Wt Readings from Last 3 Encounters:  04/19/18 150 lb (68 kg)  12/28/17 154 lb (69.9 kg)  09/07/17 149 lb (67.6 kg)     Physical Exam Vitals signs and nursing note reviewed.  Constitutional:      General: He is not in acute distress.    Appearance: He is well-developed.  HENT:     Head: Normocephalic and atraumatic.  Eyes:     Conjunctiva/sclera: Conjunctivae normal.     Pupils: Pupils are equal, round, and reactive to light.  Neck:     Musculoskeletal: Normal range of motion.  Cardiovascular:     Rate and Rhythm: Normal rate and regular rhythm.     Heart sounds: Normal heart sounds.  Pulmonary:     Effort: Pulmonary effort is normal. No respiratory distress.   Breath sounds: Normal breath sounds.  Abdominal:     General: Bowel sounds are normal. There is no distension.     Palpations: Abdomen is soft.  Musculoskeletal: Normal range of motion.  Skin:    General: Skin is warm and dry.  Neurological:     Mental Status: He is alert and oriented to person, place, and time.  Psychiatric:        Mood and Affect: Mood normal.        Behavior: Behavior normal.        Thought Content: Thought content normal.        Judgment: Judgment normal.     ASSESSMENT/PLAN:  1. Type 1 diabetes mellitus without complication (HCC) Advised patient to use sliding scale for insulin.  Advised patient not to take large doses of the fast acting insulin when sugars are high.  Advised patient to take 10 units repeat 20 minutes and if still out of range to use a sliding scale.Advised patient to wait 2 hours after eating before checking blood glucose.  Patient with 68 CBG upon coming to appt. Given PB and crackers. Recheck was 47. Gave coke and increased to 94 - Urinalysis Dipstick - HgB A1c - Glucose (CBG) - glucose blood (TRUE METRIX BLOOD GLUCOSE TEST) test strip; Check blood sugars 3 times per  day prior to meals and at bedtime. Use as instructed  Dispense: 100 each; Refill: 11 - Comprehensive metabolic panel - CBC with Differential  2. Screen for STD (sexually transmitted disease) - Chlamydia/GC NAA, Confirmation - HIV antibody (with reflex) - RPR - Hepatitis c antibody (reflex)     Return in about 3 months (around 07/18/2018) for DM.    The patient was given clear instructions to go to ER or return to medical center if symptoms do not improve, worsen or new problems develop. The patient verbalized understanding and agreed with plan of care.   Ms. Freda Jackson. Riley Lam, FNP-BC [facility] [address] [place], [place] [address] [phone]

## 2018-04-20 LAB — CBC WITH DIFFERENTIAL/PLATELET
Basophils Absolute: 0 10*3/uL (ref 0.0–0.2)
Basos: 1 %
EOS (ABSOLUTE): 0.1 10*3/uL (ref 0.0–0.4)
Eos: 1 %
Hematocrit: 49.3 % (ref 37.5–51.0)
Hemoglobin: 16.7 g/dL (ref 13.0–17.7)
Immature Grans (Abs): 0 10*3/uL (ref 0.0–0.1)
Immature Granulocytes: 0 %
Lymphocytes Absolute: 2.1 10*3/uL (ref 0.7–3.1)
Lymphs: 29 %
MCH: 28.2 pg (ref 26.6–33.0)
MCHC: 33.9 g/dL (ref 31.5–35.7)
MCV: 83 fL (ref 79–97)
Monocytes Absolute: 0.6 10*3/uL (ref 0.1–0.9)
Monocytes: 9 %
Neutrophils Absolute: 4.3 10*3/uL (ref 1.4–7.0)
Neutrophils: 60 %
Platelets: 202 10*3/uL (ref 150–450)
RBC: 5.92 x10E6/uL — ABNORMAL HIGH (ref 4.14–5.80)
RDW: 12.5 % (ref 11.6–15.4)
WBC: 7.2 10*3/uL (ref 3.4–10.8)

## 2018-04-20 LAB — COMPREHENSIVE METABOLIC PANEL
ALT: 18 IU/L (ref 0–44)
AST: 19 IU/L (ref 0–40)
Albumin/Globulin Ratio: 2.1 (ref 1.2–2.2)
Albumin: 4.9 g/dL (ref 4.1–5.2)
Alkaline Phosphatase: 78 IU/L (ref 39–117)
BUN/Creatinine Ratio: 12 (ref 9–20)
BUN: 13 mg/dL (ref 6–20)
Bilirubin Total: 2.3 mg/dL — ABNORMAL HIGH (ref 0.0–1.2)
CO2: 25 mmol/L (ref 20–29)
Calcium: 9.7 mg/dL (ref 8.7–10.2)
Chloride: 99 mmol/L (ref 96–106)
Creatinine, Ser: 1.08 mg/dL (ref 0.76–1.27)
GFR calc Af Amer: 108 mL/min/{1.73_m2} (ref >59)
GFR calc non Af Amer: 94 mL/min/{1.73_m2} (ref >59)
Globulin, Total: 2.3 g/dL (ref 1.5–4.5)
Glucose: 98 mg/dL (ref 65–99)
Potassium: 4.7 mmol/L (ref 3.5–5.2)
Sodium: 140 mmol/L (ref 134–144)
Total Protein: 7.2 g/dL (ref 6.0–8.5)

## 2018-04-20 LAB — RPR: RPR Ser Ql: NONREACTIVE

## 2018-04-20 LAB — HIV ANTIBODY (ROUTINE TESTING W REFLEX): HIV Screen 4th Generation wRfx: NONREACTIVE

## 2018-04-20 LAB — HCV COMMENT:

## 2018-04-20 LAB — HEPATITIS C ANTIBODY (REFLEX): HCV Ab: <0.1 s/co ratio (ref 0.0–0.9)

## 2018-04-24 ENCOUNTER — Other Ambulatory Visit: Payer: Self-pay | Admitting: Family Medicine

## 2018-04-24 ENCOUNTER — Telehealth: Payer: Self-pay

## 2018-04-24 DIAGNOSIS — A749 Chlamydial infection, unspecified: Secondary | ICD-10-CM

## 2018-04-24 MED ORDER — AZITHROMYCIN 500 MG PO TABS: 1000.0000 mg | ORAL_TABLET | Freq: Every day | ORAL | 0 refills | Status: AC

## 2018-04-24 NOTE — Telephone Encounter (Signed)
-----   Message from Mike Gip, FNP sent at 04/24/2018  3:49 PM EST ----- Patient positive for chlamydia. Will need to take Azithromycin for this. Will send to the pharmacy. Please inform him that he needs to refrain from sex for the next 14 days. He needs to tell his partners for treatment.

## 2018-04-24 NOTE — Telephone Encounter (Signed)
Called, no answer. Left  Message for patient to call back. Thanks!

## 2018-04-24 NOTE — Progress Notes (Signed)
Medication sent to pharm

## 2018-04-25 ENCOUNTER — Telehealth: Payer: Self-pay

## 2018-04-25 LAB — C. TRACHOMATIS NAA, CONFIRM: C. trachomatis NAA, Confirm: POSITIVE — AB

## 2018-04-25 LAB — CHLAMYDIA/GC NAA, CONFIRMATION
Chlamydia trachomatis, NAA: POSITIVE — AB
Neisseria gonorrhoeae, NAA: NEGATIVE

## 2018-04-25 NOTE — Telephone Encounter (Signed)
Called and spoke with patient, he had already answered his question. Thanks!

## 2018-04-25 NOTE — Telephone Encounter (Signed)
Patient called back. I advised that patient was positive for chlamydia and that he needs to take azithromycin that was sent to the pharmacy exactly as directed for this. Advised that he will need to have partner(s) treated and refrain from sex for 14 days. Patient verbalized understanding. Thanks!

## 2018-04-25 NOTE — Telephone Encounter (Signed)
Called, no answer. Left a voicemail. Thanks!  

## 2018-05-02 MED FILL — TRUE METRIX TEST STRIP: 25 days supply | Qty: 100 | Fill #4

## 2018-05-02 MED FILL — $LANTUS SOLOSTAR 100 UNITS/: 100 | 96 days supply | Qty: 24 | Fill #5

## 2018-05-02 MED FILL — TRUEplus LANCETS 28G MISC: 25 days supply | Qty: 100 | Fill #1

## 2018-05-02 MED FILL — $Humalog 100u/ml vial: 100 | 88 days supply | Qty: 40 | Fill #5

## 2018-05-30 MED FILL — TRUE METRIX TEST STRIP: 25 days supply | Qty: 100 | Fill #5

## 2018-06-10 MED FILL — $Humalog 100u/ml vial: 100 | 66 days supply | Qty: 30 | Fill #6

## 2018-06-12 MED FILL — $LANTUS SOLOSTAR 100 UNITS/: 100 | 96 days supply | Qty: 24 | Fill #0

## 2018-06-12 MED FILL — TRUE METRIX TEST STRIP: 25 days supply | Qty: 100 | Fill #6

## 2018-07-02 MED FILL — TRUE METRIX TEST STRIP: 60 days supply | Qty: 200 | Fill #7

## 2018-07-19 ENCOUNTER — Ambulatory Visit: Payer: Self-pay | Admitting: Family Medicine

## 2018-07-25 ENCOUNTER — Emergency Department (HOSPITAL_BASED_OUTPATIENT_CLINIC_OR_DEPARTMENT_OTHER): Payer: Self-pay

## 2018-07-25 ENCOUNTER — Other Ambulatory Visit: Payer: Self-pay

## 2018-07-25 ENCOUNTER — Encounter (HOSPITAL_BASED_OUTPATIENT_CLINIC_OR_DEPARTMENT_OTHER): Payer: Self-pay | Admitting: Emergency Medicine

## 2018-07-25 ENCOUNTER — Emergency Department (HOSPITAL_BASED_OUTPATIENT_CLINIC_OR_DEPARTMENT_OTHER)
Admission: EM | Admit: 2018-07-25 | Discharge: 2018-07-25 | Disposition: A | Discharge status: Home / Self Care | Payer: Self-pay | Attending: Emergency Medicine | Admitting: Emergency Medicine

## 2018-07-25 DIAGNOSIS — R0789 Other chest pain: Secondary | ICD-10-CM | POA: Insufficient documentation

## 2018-07-25 DIAGNOSIS — Z79899 Other long term (current) drug therapy: Secondary | ICD-10-CM | POA: Insufficient documentation

## 2018-07-25 DIAGNOSIS — R143 Flatulence: Secondary | ICD-10-CM | POA: Insufficient documentation

## 2018-07-25 DIAGNOSIS — R140 Abdominal distension (gaseous): Secondary | ICD-10-CM | POA: Insufficient documentation

## 2018-07-25 DIAGNOSIS — Z87891 Personal history of nicotine dependence: Secondary | ICD-10-CM | POA: Insufficient documentation

## 2018-07-25 DIAGNOSIS — E109 Type 1 diabetes mellitus without complications: Secondary | ICD-10-CM | POA: Insufficient documentation

## 2018-07-25 DIAGNOSIS — R002 Palpitations: Secondary | ICD-10-CM | POA: Insufficient documentation

## 2018-07-25 DIAGNOSIS — Z794 Long term (current) use of insulin: Secondary | ICD-10-CM | POA: Insufficient documentation

## 2018-07-25 DIAGNOSIS — R0602 Shortness of breath: Secondary | ICD-10-CM | POA: Insufficient documentation

## 2018-07-25 LAB — CBC
HCT: 44.6 % (ref 39.0–52.0)
Hemoglobin: 14.7 g/dL (ref 13.0–17.0)
MCH: 28 pg (ref 26.0–34.0)
MCHC: 33 g/dL (ref 30.0–36.0)
MCV: 85 fL (ref 80.0–100.0)
Platelets: 174 10*3/uL (ref 150–400)
RBC: 5.25 MIL/uL (ref 4.22–5.81)
RDW: 11.8 % (ref 11.5–15.5)
WBC: 4.6 10*3/uL (ref 4.0–10.5)
nRBC: 0 % (ref 0.0–0.2)

## 2018-07-25 LAB — COMPREHENSIVE METABOLIC PANEL
ALT: 15 U/L (ref 0–44)
AST: 21 U/L (ref 15–41)
Albumin: 4 g/dL (ref 3.5–5.0)
Alkaline Phosphatase: 54 U/L (ref 38–126)
Anion gap: 8 (ref 5–15)
BUN: 9 mg/dL (ref 6–20)
CO2: 24 mmol/L (ref 22–32)
Calcium: 8.5 mg/dL — ABNORMAL LOW (ref 8.9–10.3)
Chloride: 103 mmol/L (ref 98–111)
Creatinine, Ser: 0.98 mg/dL (ref 0.61–1.24)
GFR calc Af Amer: >60 mL/min (ref >60)
GFR calc non Af Amer: >60 mL/min (ref >60)
Glucose, Bld: 294 mg/dL — ABNORMAL HIGH (ref 70–99)
Potassium: 3.5 mmol/L (ref 3.5–5.1)
Sodium: 135 mmol/L (ref 135–145)
Total Bilirubin: 2.3 mg/dL — ABNORMAL HIGH (ref 0.3–1.2)
Total Protein: 6.7 g/dL (ref 6.5–8.1)

## 2018-07-25 LAB — TROPONIN I: Troponin I: <0.03 ng/mL (ref <0.03)

## 2018-07-25 LAB — LIPASE, BLOOD: Lipase: 24 U/L (ref 11–51)

## 2018-07-25 LAB — TSH: TSH: 0.894 u[IU]/mL (ref 0.350–4.500)

## 2018-07-25 NOTE — ED Notes (Signed)
Patient is resting comfortably, awaiting dispo.

## 2018-07-25 NOTE — ED Notes (Signed)
ED Provider at bedside. 

## 2018-07-25 NOTE — ED Notes (Signed)
Patient transported to X-ray 

## 2018-07-25 NOTE — ED Triage Notes (Signed)
Took baking soda last night and states" It made my right side congested " SOB this am

## 2018-07-25 NOTE — ED Provider Notes (Signed)
[facility] EMERGENCY DEPARTMENT Provider Note   CSN: [id number] Arrival date & time: 07/25/18  3244    History   Chief Complaint Chief Complaint  Patient presents with  . Shortness of Breath    HPI Glenn Ball is a 28 y.o. male.     28yo M w/ h/o IDDM who p/w multiple complaints. Pt states that for several weeks he has had intermittent heart palpitations, occasional sharp chest pain that is non-exertional, occasional shortness of breath, and sometimes bloating/gas. He is not sure whether it is related to reflux. Symptoms not associated w/ eating or activity. He has tried tums and gas-X. He denies vomiting, diarrhea, urinary problems, fevers, or URI symptoms. No recent travel or h/o blood clots. He has been compliant with insulin, BG relatively stable with occasional spikes. He denies drug or alcohol use and no heavy caffeine use.  The history is provided by the patient.  Shortness of Breath    Past Medical History:  Diagnosis Date  . Depression   . Diabetes mellitus     Patient Active Problem List   Diagnosis Date Noted  . Type 1 diabetes mellitus without complication ([place]) 03/22/7251  . Dehydration 10/14/2011  . Dizziness 10/14/2011  . Weight loss 10/14/2011  . Diabetes mellitus, new onset ([place]) 10/13/2011  . Hyperglycemia 10/13/2011    History reviewed. No pertinent surgical history.      Home Medications    Prior to Admission medications   Medication Sig Start Date End Date Taking? Authorizing Provider  Blood Glucose Monitoring Suppl (TRUE METRIX AIR GLUCOSE METER) w/Device KIT 1 each by Does not apply route 4 (four) times daily -  with meals and at bedtime. 11/27/16   Dorena Dew, FNP  Blood Glucose Monitoring Suppl (TRUE METRIX AIR GLUCOSE METER) w/Device KIT 1 each by Does not apply route 4 (four) times daily -  with meals and at bedtime. 11/27/16   Dorena Dew, FNP  glucose blood (TRUE METRIX BLOOD GLUCOSE TEST) test strip Check blood  sugars 3 times per day prior to meals and at bedtime. Use as instructed 04/19/18   Lanae Boast, FNP  Insulin Glargine (LANTUS SOLOSTAR) 100 UNIT/ML Solostar Pen Inject 25 Units into the skin daily at 10 pm. 12/28/17   Lanae Boast, FNP  insulin lispro (HUMALOG) 100 UNIT/ML cartridge Inject 0-0.15 mLs (0-15 Units total) into the skin 3 (three) times daily before meals. 12/28/17   Lanae Boast, FNP  Lancet Device MISC 1 each by Does not apply route 4 (four) times daily -  before meals and at bedtime. 03/02/17   Dorena Dew, FNP  TRUEPLUS LANCETS 28G MISC 1 each by Does not apply route 4 (four) times daily. 03/19/18   Lanae Boast, [place] TO FIND Med Name: 120 syringes and needles for insulin 10/15/11   Bynum Bellows, MD  UNABLE TO FIND Please excuse Mr. Kingsten Enfield from any work or school obligations from 10/13/2011 till 10/29/2011 as he was hospitalized. 10/15/11   Bynum Bellows, MD    Family History Family History  Problem Relation Age of Onset  . Hypertension Mother   . Cancer Maternal Grandmother   . Hypertension Maternal Grandfather   . Diabetes Maternal Grandfather     Social History Social History   Tobacco Use  . Smoking status: Former Research scientist (life sciences)  . Smokeless tobacco: Never Used  Substance Use Topics  . Alcohol use: Yes    Comment: occ  . Drug use: Yes  Types: Marijuana    Comment: 1-2 times a month. occ     Allergies   Sulfa antibiotics   Review of Systems Review of Systems  Respiratory: Positive for shortness of breath.    All other systems reviewed and are negative except that which was mentioned in HPI  Physical Exam Updated Vital Signs BP 117/76   Pulse 79   Temp 98 F (36.7 C) (Oral)   Resp 17   Ht '5\' 9"'  (1.753 m)   Wt 68.9 kg   SpO2 100%   BMI 22.45 kg/m   Physical Exam Vitals signs and nursing note reviewed.  Constitutional:      General: He is not in acute distress.    Appearance: He is well-developed.  HENT:     Head:  Normocephalic and atraumatic.     Mouth/Throat:     Mouth: Mucous membranes are moist.  Eyes:     Conjunctiva/sclera: Conjunctivae normal.  Neck:     Musculoskeletal: Neck supple.  Cardiovascular:     Rate and Rhythm: Normal rate and regular rhythm.     Heart sounds: Normal heart sounds. No murmur.  Pulmonary:     Effort: Pulmonary effort is normal.     Breath sounds: Normal breath sounds.  Abdominal:     General: Bowel sounds are normal. There is no distension.     Palpations: Abdomen is soft.     Tenderness: There is no abdominal tenderness.  Musculoskeletal:     Right lower leg: No edema.     Left lower leg: No edema.  Skin:    General: Skin is warm and dry.  Neurological:     Mental Status: He is alert and oriented to person, place, and time.     Comments: Fluent speech  Psychiatric:        Judgment: Judgment normal.      ED Treatments / Results  Labs (all labs ordered are listed, but only abnormal results are displayed) Labs Reviewed  COMPREHENSIVE METABOLIC PANEL - Abnormal; Notable for the following components:      Result Value   Glucose, Bld 294 (*)    Calcium 8.5 (*)    Total Bilirubin 2.3 (*)    All other components within normal limits  CBC  LIPASE, BLOOD  TROPONIN I  TSH    EKG EKG Interpretation  Date/Time:  Thursday Jul 25 2018 08:38:58 EDT Ventricular Rate:  85 PR Interval:    QRS Duration: 82 QT Interval:  353 QTC Calculation: 420 R Axis:   83 Text Interpretation:  Sinus rhythm ST elevation, consider inferior injury some artifact but overall similar to previous Confirmed by Theotis Burrow [phone]) on 07/25/2018 9:18:06 AM   Radiology Dg Chest 2 View  Result Date: 07/25/2018 CLINICAL DATA:  Several days of shortness of breath and RIGHT-sided chest pain with nasal congestion. EXAM: CHEST - 2 VIEW COMPARISON:  None. FINDINGS: The heart size and mediastinal contours are within normal limits. Both lungs are clear. The visualized skeletal  structures are unremarkable. IMPRESSION: No active cardiopulmonary disease. Electronically Signed   By: Staci Righter M.D.   On: 07/25/2018 08:57    Procedures Procedures (including critical care time)  Medications Ordered in ED Medications - No data to display   Initial Impression / Assessment and Plan / ED Course  I have reviewed the triage vital signs and the nursing notes.  Pertinent labs & imaging results that were available during my care of the patient were reviewed by me  and considered in my medical decision making (see chart for details).        Well appearing, ambulatory, comfortable on exam, normal VS. No complaints currently and no abd pain. EKG unremarkable. CXR clear. Labs show reassuring CMP and CBC, normal lipase and trop. BG 294 without evidence of DKA.   PERC negative therefore PE very unlikely especially given atypical symptoms. I highly doubt ACS given young age and atypical story. TSH sent and pending, can be followed up by [facility]. Recommended PCP follow-up and extensively reviewed return precautions.  He voiced understanding.  Final Clinical Impressions(s) / ED Diagnoses   Final diagnoses:  None    ED Discharge Orders    None       Little, Wenda Overland, MD 07/25/18 1037

## 2018-07-26 ENCOUNTER — Ambulatory Visit: Payer: Self-pay | Admitting: Family Medicine

## 2018-08-02 ENCOUNTER — Other Ambulatory Visit: Payer: Self-pay

## 2018-08-02 ENCOUNTER — Encounter: Payer: Self-pay | Admitting: Family Medicine

## 2018-08-02 ENCOUNTER — Ambulatory Visit (INDEPENDENT_AMBULATORY_CARE_PROVIDER_SITE_OTHER): Payer: Self-pay | Admitting: Family Medicine

## 2018-08-02 VITALS — BP 120/64 | HR 68 | Temp 98.6°F | Resp 14 | Ht 68.0 in | Wt 155.0 lb

## 2018-08-02 DIAGNOSIS — E109 Type 1 diabetes mellitus without complications: Secondary | ICD-10-CM

## 2018-08-02 DIAGNOSIS — L209 Atopic dermatitis, unspecified: Secondary | ICD-10-CM

## 2018-08-02 DIAGNOSIS — K219 Gastro-esophageal reflux disease without esophagitis: Secondary | ICD-10-CM

## 2018-08-02 LAB — POCT URINALYSIS DIPSTICK
Bilirubin, UA: NEGATIVE
Blood, UA: NEGATIVE
Glucose, UA: NEGATIVE
Ketones, UA: NEGATIVE
Leukocytes, UA: NEGATIVE
Nitrite, UA: NEGATIVE
Protein, UA: NEGATIVE
Spec Grav, UA: 1.01 (ref 1.010–1.025)
Urobilinogen, UA: 0.2 E.U./dL
pH, UA: 7.5 (ref 5.0–8.0)

## 2018-08-02 LAB — POCT GLYCOSYLATED HEMOGLOBIN (HGB A1C): Hemoglobin A1C: 7.1 % — AB (ref 4.0–5.6)

## 2018-08-02 LAB — GLUCOSE, POCT (MANUAL RESULT ENTRY): POC Glucose: 122 mg/dl — AB (ref 70–99)

## 2018-08-02 MED ORDER — GLUCOSE BLOOD VI STRP: ORAL_STRIP | 11 refills | Status: DC

## 2018-08-02 MED ORDER — OMEPRAZOLE 40 MG PO CPDR: 40.0000 mg | DELAYED_RELEASE_CAPSULE | Freq: Every day | ORAL | 2 refills | Status: DC

## 2018-08-02 MED ORDER — TRIAMCINOLONE ACETONIDE 0.025 % EX OINT: 1.0000 "application " | TOPICAL_OINTMENT | Freq: Two times a day (BID) | CUTANEOUS | 0 refills | Status: DC

## 2018-08-02 MED FILL — TRIAMCINOLONE 0.025% OINT: 0.025 | 7 days supply | Qty: 15 | Fill #0

## 2018-08-02 MED FILL — TRUE METRIX TEST STRIP: 30 days supply | Qty: 100 | Fill #0

## 2018-08-02 MED FILL — OMEPRAZOLE DR 40 MG CAPSULE: 40 | 30 days supply | Qty: 30 | Fill #0

## 2018-08-02 NOTE — Patient Instructions (Signed)
Atopic Dermatitis Atopic dermatitis is a skin disorder that causes inflammation of the skin. This is the most common type of eczema. Eczema is a group of skin conditions that cause the skin to be itchy, red, and swollen. This condition is generally worse during the cooler winter months and often improves during the warm summer months. Symptoms can vary from person to person. Atopic dermatitis usually starts showing signs in infancy and can last through adulthood. This condition cannot be passed from one person to another (non-contagious), but it is more common in families. Atopic dermatitis may not always be present. When it is present, it is called a flare-up. What are the causes? The exact cause of this condition is not known. Flare-ups of the condition may be triggered by:  Contact with something that you are sensitive or allergic to.  Stress.  Certain foods.  Extremely hot or cold weather.  Harsh chemicals and soaps.  Dry air.  Chlorine. What increases the risk? This condition is more likely to develop in people who have a personal history or family history of eczema, allergies, asthma, or hay fever. What are the signs or symptoms? Symptoms of this condition include:  Dry, scaly skin.  Red, itchy rash.  Itchiness, which can be severe. This may occur before the skin rash. This can make sleeping difficult.  Skin thickening and cracking that can occur over time. How is this diagnosed? This condition is diagnosed based on your symptoms, a medical history, and a physical exam. How is this treated? There is no cure for this condition, but symptoms can usually be controlled. Treatment focuses on:  Controlling the itchiness and scratching. You may be given medicines, such as antihistamines or steroid creams.  Limiting exposure to things that you are sensitive or allergic to (allergens).  Recognizing situations that cause stress and developing a plan to manage stress. If your  atopic dermatitis does not get better with medicines, or if it is all over your body (widespread), a treatment using a specific type of light (phototherapy) may be used. Follow these instructions at home: Skin care   Keep your skin well-moisturized. Doing this seals in moisture and helps to prevent dryness. ? Use unscented lotions that have petroleum in them. ? Avoid lotions that contain alcohol or water. They can dry the skin.  Keep baths or showers short (less than 5 minutes) in warm water. Do not use hot water. ? Use mild, unscented cleansers for bathing. Avoid soap and bubble bath. ? Apply a moisturizer to your skin right after a bath or shower.  Do not apply anything to your skin without checking with your health care provider. General instructions  Dress in clothes made of cotton or cotton blends. Dress lightly because heat increases itchiness.  When washing your clothes, rinse your clothes twice so all of the soap is removed.  Avoid any triggers that can cause a flare-up.  Try to manage your stress.  Keep your fingernails cut short.  Avoid scratching. Scratching makes the rash and itchiness worse. It may also result in a skin infection (impetigo) due to a break in the skin caused by scratching.  Take or apply over-the-counter and prescription medicines only as told by your health care provider.  Keep all follow-up visits as told by your health care provider. This is important.  Do not be around people who have cold sores or fever blisters. If you get the infection, it may cause your atopic dermatitis to worsen. Contact a health   care provider if:  Your itchiness interferes with sleep.  Your rash gets worse or it is not better within one week of starting treatment.  You have a fever.  You have a rash flare-up after having contact with someone who has cold sores or fever blisters. Get help right away if:  You develop pus or soft yellow scabs in the rash area. Summary   This condition causes a red rash and itchy, dry, scaly skin.  Treatment focuses on controlling the itchiness and scratching, limiting exposure to things that you are sensitive or allergic to (allergens), recognizing situations that cause stress, and developing a plan to manage stress.  Keep your skin well-moisturized.  Keep baths or showers shorter than 5 minutes and use warm water. Do not use hot water. This information is not intended to replace advice given to you by your health care provider. Make sure you discuss any questions you have with your health care provider. Document Released: 03/03/2000 Document Revised: 04/07/2016 Document Reviewed: 04/07/2016 Elsevier Interactive Patient Education  2019 [employer] Heartburn Heartburn is a type of pain or discomfort that can happen in the throat or chest. It is often described as a burning pain. It may also cause a bad, acid-like taste in the mouth. Heartburn may feel worse when you lie down or bend over. It may be worse at night. It may be caused by stomach contents that move back up (reflux) into the tube that connects the mouth with the stomach (esophagus). Follow these instructions at home: Eating and drinking   Avoid certain foods and drinks as told by your doctor. This may include: ? Coffee and tea (with or without caffeine). ? Drinks that have alcohol. ? Energy drinks and sports drinks. ? Carbonated drinks or sodas. ? Chocolate and cocoa. ? Peppermint and mint flavorings. ? Garlic and onions. ? Horseradish. ? Spicy and acidic foods, such as:  Peppers.  Chili powder and curry powder.  Vinegar.  Hot sauces and BBQ sauce. ? Citrus fruit juices and citrus fruits, such as:  Oranges.  Lemons.  Limes. ? Tomato-based foods, such as:  Red sauce and pizza with red sauce.  Chili.  Salsa. ? Fried and fatty foods, such as:  Donuts.  [place] fries and potato chips.  High-fat dressings. ? High-fat meats, such as:   Hot dogs and sausage.  Rib eye steak.  Ham and bacon. ? High-fat dairy items, such as:  Whole milk.  Butter.  Cream cheese.  Eat small meals often. Avoid eating large meals.  Avoid drinking large amounts of liquid with your meals.  Avoid eating meals during the 2-3 hours before bedtime.  Avoid lying down right after you eat.  Do not exercise right after you eat. Lifestyle      If you are overweight, lose an amount of weight that is healthy for you. Ask your doctor about a safe weight loss goal.  Do not use any products that contain nicotine or tobacco, including cigarettes, e-cigarettes, and chewing tobacco. These can make your symptoms worse. If you need help quitting, ask your doctor.  Wear loose clothes. Do not wear anything tight around your waist.  Raise (elevate) the head of your bed about 6 inches (15 cm) when you sleep.  Try to lower your stress. If you need help doing this, ask your doctor. General instructions  Pay attention to any changes in your symptoms.  Take over-the-counter and prescription medicines only as told by your doctor. ? Do not take  aspirin, ibuprofen, or other NSAIDs unless your doctor says it is okay. ? Stop medicines only as told by your doctor.  Keep all follow-up visits as told by your doctor. This is important. Contact a doctor if:  You have new symptoms.  You lose weight and you do not know why it is happening.  You have trouble swallowing, or it hurts to swallow.  You have wheezing or a cough that keeps happening.  Your symptoms do not get better with treatment.  You have heartburn often for more than 2 weeks. Get help right away if:  You have pain in your arms, neck, jaw, teeth, or back.  You feel sweaty, dizzy, or light-headed.  You have chest pain or shortness of breath.  You throw up (vomit) and your throw up looks like blood or coffee grounds.  Your poop (stool) is bloody or black. These symptoms may  represent a serious problem that is an emergency. Do not wait to see if the symptoms will go away. Get medical help right away. Call your local emergency services (911 in the [place] Do not drive yourself to the hospital. Summary  Heartburn is a type of pain that can happen in the throat or chest. It can feel like a burning pain. It may also cause a bad, acid-like taste in the mouth.  You may need to avoid certain foods and drinks to help your symptoms. Ask your doctor what foods and drinks you should avoid.  Take over-the-counter and prescription medicines only as told by your doctor. Do not take aspirin, ibuprofen, or other NSAIDs unless your doctor told you to do so.  Contact your doctor if your symptoms do not get better or they get worse. This information is not intended to replace advice given to you by your health care provider. Make sure you discuss any questions you have with your health care provider. Document Released: 11/16/2010 Document Revised: 08/06/2017 Document Reviewed: 08/06/2017 Elsevier Interactive Patient Education  2019 [employer].

## 2018-08-02 NOTE — Progress Notes (Signed)
[facility]   Progress Note: General Provider: Mike GipAndre Siarah Deleo, FNP  SUBJECTIVE:   Glenn Ball is a 28 y.o. male who  has a past medical history of Depression and Diabetes mellitus.. Patient presents today for Diabetes; Heartburn; and Rash (behind ears and chest ) Diabetes  He presents for his follow-up diabetic visit. He has type 1 diabetes mellitus. No MedicAlert identification noted. His disease course has been stable. There are no hypoglycemic associated symptoms. There are no diabetic associated symptoms. There are no hypoglycemic complications. There are no diabetic complications. Risk factors for coronary artery disease include male sex, stress and family history. Current diabetic treatment includes insulin injections. He is compliant with treatment most of the time. His weight is stable. He is following a generally unhealthy diet. When asked about meal planning, he reported none. He has not had a previous visit with a dietitian. He rarely participates in exercise. He does not see a podiatrist.Eye exam is not current.  Heartburn  He complains of heartburn. He reports no abdominal pain or no hoarse voice. This is a new problem. The current episode started 1 to 4 weeks ago. The problem occurs occasionally. The problem has been gradually improving. The heartburn duration is several minutes. The heartburn is located in the substernum. The heartburn is of mild intensity. The heartburn does not wake him from sleep. The heartburn does not limit his activity. The heartburn doesn't change with position. The symptoms are aggravated by certain foods and lying down. He has tried an antacid for the symptoms. The treatment provided mild relief.  Rash  This is a chronic problem. The current episode started more than 1 month ago. The problem has been waxing and waning since onset. The affected locations include the head, left ear and right ear. The rash is  characterized by scaling and itchiness. He was exposed to nothing. Past treatments include topical steroids. The treatment provided moderate relief. His past medical history is significant for eczema.      Review of Systems  HENT: Negative for hoarse voice.   Gastrointestinal: Positive for heartburn. Negative for abdominal pain.  Skin: Positive for rash.     OBJECTIVE: BP 120/64 (BP Location: Left Arm, Patient Position: Sitting, Cuff Size: Normal)   Pulse 68   Temp 98.6 F (37 C) (Oral)   Resp 14   Ht 5\' 8"  (1.727 m)   Wt 155 lb (70.3 kg)   SpO2 100%   BMI 23.57 kg/m   Wt Readings from Last 3 Encounters:  08/02/18 155 lb (70.3 kg)  07/25/18 152 lb (68.9 kg)  04/19/18 150 lb (68 kg)     Physical Exam Vitals signs and nursing note reviewed.  Constitutional:      General: He is not in acute distress.    Appearance: Normal appearance.  HENT:     Head: Normocephalic and atraumatic.  Eyes:     Extraocular Movements: Extraocular movements intact.     Conjunctiva/sclera: Conjunctivae normal.     Pupils: Pupils are equal, round, and reactive to light.  Cardiovascular:     Rate and Rhythm: Normal rate and regular rhythm.     Heart sounds: No murmur.  Pulmonary:     Effort: Pulmonary effort is normal.     Breath sounds: Normal breath sounds.  Musculoskeletal: Normal range of motion.  Skin:    General: Skin is warm and dry.     Findings: Rash present. Rash  is scaling (darkening scaly skin noted behind bilateral ears. ).  Neurological:     Mental Status: He is alert and oriented to person, place, and time.  Psychiatric:        Mood and Affect: Mood normal.        Behavior: Behavior normal.        Thought Content: Thought content normal.        Judgment: Judgment normal.     ASSESSMENT/PLAN:   1. Type 1 diabetes mellitus without complication (HCC) - Glucose (CBG) - HgB A1c - Urinalysis Dipstick - glucose blood (TRUE METRIX BLOOD GLUCOSE TEST) test strip; Check  blood sugars 3 times per day prior to meals and at bedtime. Use as instructed  Dispense: 100 each; Refill: 11  2. Gastroesophageal reflux disease without esophagitis - omeprazole (PRILOSEC) 40 MG capsule; Take 1 capsule (40 mg total) by mouth daily.  Dispense: 30 capsule; Refill: 2 - H. pylori breath test  3. Atopic dermatitis, unspecified type - triamcinolone (KENALOG) 0.025 % ointment; Apply 1 application topically 2 (two) times daily.  Dispense: 60 g; Refill: 0     Return in about 3 months (around 11/02/2018) for DM.    The patient was given clear instructions to go to ER or return to medical center if symptoms do not improve, worsen or new problems develop. The patient verbalized understanding and agreed with plan of care.   Ms. Freda Jackson. Riley Lam, FNP-BC [facility] [address] [place], [place] [address] [phone]

## 2018-08-03 ENCOUNTER — Other Ambulatory Visit: Payer: Self-pay | Admitting: Family Medicine

## 2018-08-03 DIAGNOSIS — E109 Type 1 diabetes mellitus without complications: Secondary | ICD-10-CM

## 2018-08-03 LAB — H. PYLORI BREATH TEST: H pylori Breath Test: NEGATIVE

## 2018-08-05 MED FILL — $Humalog 100u/ml vial: 100 | 66 days supply | Qty: 30 | Fill #0

## 2018-08-22 MED FILL — OMEPRAZOLE DR 40 MG CAPSULE: 40 | 30 days supply | Qty: 30 | Fill #1

## 2018-09-24 MED FILL — $LANTUS SOLOSTAR 100 UNITS/: 100 | 96 days supply | Qty: 24 | Fill #1

## 2018-09-24 MED FILL — OMEPRAZOLE DR 40 MG CAPSULE: 40 | 30 days supply | Qty: 30 | Fill #2

## 2018-09-27 MED FILL — TRUE METRIX TEST STRIP: 60 days supply | Qty: 200 | Fill #1

## 2018-10-26 ENCOUNTER — Other Ambulatory Visit: Payer: Self-pay

## 2018-10-26 ENCOUNTER — Emergency Department (HOSPITAL_BASED_OUTPATIENT_CLINIC_OR_DEPARTMENT_OTHER): Payer: Self-pay

## 2018-10-26 ENCOUNTER — Emergency Department (HOSPITAL_BASED_OUTPATIENT_CLINIC_OR_DEPARTMENT_OTHER)
Admission: EM | Admit: 2018-10-26 | Discharge: 2018-10-26 | Disposition: A | Discharge status: Home / Self Care | Payer: Self-pay | Attending: Emergency Medicine | Admitting: Emergency Medicine

## 2018-10-26 DIAGNOSIS — Z87891 Personal history of nicotine dependence: Secondary | ICD-10-CM | POA: Insufficient documentation

## 2018-10-26 DIAGNOSIS — K219 Gastro-esophageal reflux disease without esophagitis: Secondary | ICD-10-CM | POA: Insufficient documentation

## 2018-10-26 DIAGNOSIS — Z79899 Other long term (current) drug therapy: Secondary | ICD-10-CM | POA: Insufficient documentation

## 2018-10-26 DIAGNOSIS — E109 Type 1 diabetes mellitus without complications: Secondary | ICD-10-CM | POA: Insufficient documentation

## 2018-10-26 DIAGNOSIS — Z794 Long term (current) use of insulin: Secondary | ICD-10-CM | POA: Insufficient documentation

## 2018-10-26 LAB — COMPREHENSIVE METABOLIC PANEL
ALT: 11 U/L (ref 0–44)
AST: 18 U/L (ref 15–41)
Albumin: 4.2 g/dL (ref 3.5–5.0)
Alkaline Phosphatase: 62 U/L (ref 38–126)
Anion gap: 10 (ref 5–15)
BUN: 15 mg/dL (ref 6–20)
CO2: 25 mmol/L (ref 22–32)
Calcium: 9.2 mg/dL (ref 8.9–10.3)
Chloride: 100 mmol/L (ref 98–111)
Creatinine, Ser: 1.1 mg/dL (ref 0.61–1.24)
GFR calc Af Amer: >60 mL/min (ref >60)
GFR calc non Af Amer: >60 mL/min (ref >60)
Glucose, Bld: 205 mg/dL — ABNORMAL HIGH (ref 70–99)
Potassium: 4.4 mmol/L (ref 3.5–5.1)
Sodium: 135 mmol/L (ref 135–145)
Total Bilirubin: 2.9 mg/dL — ABNORMAL HIGH (ref 0.3–1.2)
Total Protein: 7 g/dL (ref 6.5–8.1)

## 2018-10-26 LAB — CBC WITH DIFFERENTIAL/PLATELET
Abs Immature Granulocytes: 0.01 10*3/uL (ref 0.00–0.07)
Basophils Absolute: 0 10*3/uL (ref 0.0–0.1)
Basophils Relative: 1 %
Eosinophils Absolute: 0.1 10*3/uL (ref 0.0–0.5)
Eosinophils Relative: 1 %
HCT: 48.5 % (ref 39.0–52.0)
Hemoglobin: 15.9 g/dL (ref 13.0–17.0)
Immature Granulocytes: 0 %
Lymphocytes Relative: 34 %
Lymphs Abs: 1.8 10*3/uL (ref 0.7–4.0)
MCH: 27.7 pg (ref 26.0–34.0)
MCHC: 32.8 g/dL (ref 30.0–36.0)
MCV: 84.3 fL (ref 80.0–100.0)
Monocytes Absolute: 0.4 10*3/uL (ref 0.1–1.0)
Monocytes Relative: 8 %
Neutro Abs: 3 10*3/uL (ref 1.7–7.7)
Neutrophils Relative %: 56 %
Platelets: 194 10*3/uL (ref 150–400)
RBC: 5.75 MIL/uL (ref 4.22–5.81)
RDW: 11.6 % (ref 11.5–15.5)
WBC: 5.3 10*3/uL (ref 4.0–10.5)
nRBC: 0 % (ref 0.0–0.2)

## 2018-10-26 LAB — D-DIMER, QUANTITATIVE: D-Dimer, Quant: <0.27 ug/mL-FEU (ref 0.00–0.50)

## 2018-10-26 LAB — CBG MONITORING, ED: Glucose-Capillary: 161 mg/dL — ABNORMAL HIGH (ref 70–99)

## 2018-10-26 LAB — LIPASE, BLOOD: Lipase: 21 U/L (ref 11–51)

## 2018-10-26 MED ORDER — ALUM & MAG HYDROXIDE-SIMETH 200-200-20 MG/5ML PO SUSP
30.0000 mL | Freq: Once | ORAL | Status: AC
  Administered 2018-10-26: 15:00:00 30 mL via ORAL
  Filled 2018-10-26: qty 30

## 2018-10-26 MED ORDER — LIDOCAINE VISCOUS HCL 2 % MT SOLN
15.0000 mL | Freq: Once | OROMUCOSAL | Status: AC
  Administered 2018-10-26: 15 mL via ORAL
  Filled 2018-10-26: qty 15

## 2018-10-26 NOTE — Discharge Instructions (Signed)
Your laboratory results are within normal limits today, please continue taking your omeprazole to help with your symptoms.  Please follow-up with your primary care physician for further management of your acid reflux.  Please avoid any spicy, caffeine, citrusy food as this can worsen your symptoms.

## 2018-10-26 NOTE — ED Notes (Signed)
Patient is resting comfortably. 

## 2018-10-26 NOTE — ED Provider Notes (Signed)
[facility] EMERGENCY DEPARTMENT Provider Note   CSN: [id number] Arrival date & time: 10/26/18  1341    History   Chief Complaint Chief Complaint  Patient presents with  . Arm Pain    HPI Glenn Ball is a 28 y.o. male.     28 y.o male with a PMH of DM, Dizziness presents to the ED with a chief complaint of epigastric pain x 2 months. Patient reports he was prescribed by his primary care some omeprazole which she has been taken for the past 2 months.  He reports a right-sided pressure on his upper abdomen which radiates to his left, states this feels like reflux.  He reports the omeprazole has helped with his symptoms, however he states that his symptoms are not completely gone.  States that when he stretches his pressure is relieved, it does feel like a burning sensation, feels like he is bloated on his abdomen.  He has tried taking Tums, Gas-X, Pepto, Alka-Seltzer plus, milk with some improvement in symptoms.  He also endorses some shortness of breath, states this is worse with movement.  He also endorses some lower back pain which radiates into his tailbone.  Patient reports smoking daily marijuana.  He denies any nausea, vomiting, previous abdominal surgeries, history of blood clots, dizziness.  The history is provided by the patient.    Past Medical History:  Diagnosis Date  . Depression   . Diabetes mellitus     Patient Active Problem List   Diagnosis Date Noted  . Type 1 diabetes mellitus without complication ([place]) 66/08/3014  . Dehydration 10/14/2011  . Dizziness 10/14/2011  . Weight loss 10/14/2011  . Diabetes mellitus, new onset ([place]) 10/13/2011  . Hyperglycemia 10/13/2011    No past surgical history on file.      Home Medications    Prior to Admission medications   Medication Sig Start Date End Date Taking? Authorizing Provider  Blood Glucose Monitoring Suppl (TRUE METRIX AIR GLUCOSE METER) w/Device KIT 1 each by Does not apply route 4 (four) times  daily -  with meals and at bedtime. 11/27/16   Dorena Dew, FNP  Blood Glucose Monitoring Suppl (TRUE METRIX AIR GLUCOSE METER) w/Device KIT 1 each by Does not apply route 4 (four) times daily -  with meals and at bedtime. 11/27/16   Dorena Dew, FNP  glucose blood (TRUE METRIX BLOOD GLUCOSE TEST) test strip Check blood sugars 3 times per day prior to meals and at bedtime. Use as instructed 08/02/18   Lanae Boast, FNP  HUMALOG 100 UNIT/ML injection INJECT 0-0.15 MLS (0-15 UNITS TOTAL) INTO THE SKIN 3 (THREE) TIMES DAILY BEFORE MEALS. 08/05/18   Lanae Boast, FNP  Insulin Glargine (LANTUS SOLOSTAR) 100 UNIT/ML Solostar Pen Inject 25 Units into the skin daily at 10 pm. 12/28/17   Lanae Boast, FNP  Lancet Device MISC 1 each by Does not apply route 4 (four) times daily -  before meals and at bedtime. 03/02/17   Dorena Dew, FNP  omeprazole (PRILOSEC) 40 MG capsule Take 1 capsule (40 mg total) by mouth daily. 08/02/18   Lanae Boast, FNP  triamcinolone (KENALOG) 0.025 % ointment Apply 1 application topically 2 (two) times daily. 08/02/18   Lanae Boast, FNP  TRUEPLUS LANCETS 28G MISC 1 each by Does not apply route 4 (four) times daily. 03/19/18   Lanae Boast, [place] TO FIND Med Name: 120 syringes and needles for insulin 10/15/11   Bynum Bellows, MD  UNABLE TO FIND Please excuse Mr. Axxel Gude from any work or school obligations from 10/13/2011 till 10/29/2011 as he was hospitalized. 10/15/11   Bynum Bellows, MD    Family History Family History  Problem Relation Age of Onset  . Hypertension Mother   . Cancer Maternal Grandmother   . Hypertension Maternal Grandfather   . Diabetes Maternal Grandfather     Social History Social History   Tobacco Use  . Smoking status: Former Research scientist (life sciences)  . Smokeless tobacco: Never Used  Substance Use Topics  . Alcohol use: Yes    Comment: occ  . Drug use: Yes    Types: Marijuana    Comment: 1-2 times a month. occ     Allergies    Sulfa antibiotics   Review of Systems Review of Systems  Constitutional: Negative for chills and fever.  HENT: Negative for ear pain and sore throat.   Eyes: Negative for pain and visual disturbance.  Respiratory: Positive for shortness of breath. Negative for cough.   Cardiovascular: Positive for chest pain. Negative for palpitations.  Gastrointestinal: Positive for abdominal pain. Negative for blood in stool, constipation, diarrhea, nausea and vomiting.  Genitourinary: Negative for dysuria, flank pain and hematuria.  Musculoskeletal: Negative for arthralgias and back pain.  Skin: Negative for color change and rash.  Neurological: Negative for seizures, syncope, light-headedness and headaches.  All other systems reviewed and are negative.    Physical Exam Updated Vital Signs BP 116/68   Pulse 88   Temp 98.2 F (36.8 C) (Oral)   Resp (!) 25   Ht '5\' 9"'  (1.753 m)   Wt 68.5 kg   SpO2 99%   BMI 22.30 kg/m   Physical Exam Vitals signs and nursing note reviewed.  Constitutional:      Appearance: He is well-developed.     Comments: Non-ill-appearing.  HENT:     Head: Normocephalic and atraumatic.  Eyes:     General: No scleral icterus.    Pupils: Pupils are equal, round, and reactive to light.  Neck:     Musculoskeletal: Normal range of motion.  Cardiovascular:     Heart sounds: Normal heart sounds.  Pulmonary:     Effort: Pulmonary effort is normal.     Breath sounds: Normal breath sounds. No wheezing.     Comments: Lungs are clear to auscultation, no wheezing, rhonchi, rales. Chest:     Chest wall: No tenderness.     Comments: No tenderness to palpation along the chest wall. Abdominal:     General: Bowel sounds are normal. There is no distension.     Palpations: Abdomen is soft.     Tenderness: There is no abdominal tenderness. There is no right CVA tenderness or left CVA tenderness.     Comments: No tenderness to palpation along the abdomen region on exam.   Negative CVA.  Musculoskeletal:        General: No tenderness or deformity.  Skin:    General: Skin is warm and dry.  Neurological:     Mental Status: He is alert and oriented to person, place, and time.     Comments: Alert, oriented, thought content appropriate. Speech fluent without evidence of aphasia. Able to follow 2 step commands without difficulty.  Cranial Nerves:  II:  Peripheral visual fields grossly normal, pupils, round, reactive to light III,IV, VI: ptosis not present, extra-ocular motions intact bilaterally  V,VII: smile symmetric, facial light touch sensation equal VIII: hearing grossly normal bilaterally  IX,X: midline uvula  rise  XI: bilateral shoulder shrug equal and strong XII: midline tongue extension  Motor:  5/5 in upper and lower extremities bilaterally including strong and equal grip strength and dorsiflexion/plantar flexion Sensory: light touch normal in all extremities.  Cerebellar: normal finger-to-nose with bilateral upper extremities, pronator drift negative Gait: normal gait and balance       ED Treatments / Results  Labs (all labs ordered are listed, but only abnormal results are displayed) Labs Reviewed  COMPREHENSIVE METABOLIC PANEL - Abnormal; Notable for the following components:      Result Value   Glucose, Bld 205 (*)    Total Bilirubin 2.9 (*)    All other components within normal limits  CBG MONITORING, ED - Abnormal; Notable for the following components:   Glucose-Capillary 161 (*)    All other components within normal limits  CBC WITH DIFFERENTIAL/PLATELET  LIPASE, BLOOD  D-DIMER, QUANTITATIVE (NOT AT [facility])    EKG None  Radiology Dg Chest 2 View  Result Date: 10/26/2018 CLINICAL DATA:  28 year old male with a history of smoking and chest pain EXAM: CHEST - 2 VIEW COMPARISON:  Jul 25, 2018 FINDINGS: The heart size and mediastinal contours are within normal limits. Both lungs are clear. The visualized skeletal structures are  unremarkable. IMPRESSION: No active cardiopulmonary disease. Electronically Signed   By: Corrie Mckusick D.O.   On: 10/26/2018 14:40    Procedures Procedures (including critical care time)  Medications Ordered in ED Medications  alum & mag hydroxide-simeth (MAALOX/MYLANTA) 200-200-20 MG/5ML suspension 30 mL (30 mLs Oral Given 10/26/18 1527)    And  lidocaine (XYLOCAINE) 2 % viscous mouth solution 15 mL (15 mLs Oral Given 10/26/18 1527)     Initial Impression / Assessment and Plan / ED Course  I have reviewed the triage vital signs and the nursing notes.  Pertinent labs & imaging results that were available during my care of the patient were reviewed by me and considered in my medical decision making (see chart for details).      Patient with a past medical history of diabetes type 1 presents to the ED with a chief complaint of chest pressure radiating from his right side to the left, states he feels like this is somewhat of the epigastric region.  He has been taking omeprazole for what his PCP believed to be reflux.  States his symptoms have not completely resolved but the medication has improved in symptoms.  He also endorses some shortness of breath while ambulating, states he thought this was due to him wearing a mask, however, states he feels like even without the mask he has no relief.  Will obtain EKG, chest x-ray, basic lab work to further evaluate patient's complaint.  Patient arrived in the ED with stable vital signs, satting at 99% on room air, afebrile without tachycardia.  CMP showed slight increase in glucose, patient is a type I diabetic currently drinking and eating appropriately.  Low suspicion for any DKA.  LFTs are within normal limits.  Lipase is normal, will be treated for any pancreatitis.  CBC showed no leukocytosis, hemoglobin is within normal limits.  Patient shows concern for blood clots, obtain a d-dimer as he is very low risk, denies any cigarette smoking, does work at a  call center.  D-dimer level was negative today, doubt pulmonary embolism.  Chest x-ray showed no acute process such as pneumothorax, pleural effusion or consolidation.  EKG showed no changes consistent with infarct or STEMI.  Patient was given Maalox  to help with his symptoms.  4:47 PM patient reevaluated by me, states his pain has improved significantly, advised to follow-up with PCP as I believe his symptoms are originating from his reflux, he would need to change his diet along with continue his omeprazole therapy.  He is nontoxic-appearing, with stable vital signs, no vomiting while in the ED, tolerating p.o. appropriately.  Patient stable for discharge.  Return precautions discussed at length.  Portions of this note were generated with Lobby[employer]. Dictation errors may occur despite best attempts at proofreading.  Final Clinical Impressions(s) / ED Diagnoses   Final diagnoses:  Gastroesophageal reflux disease, esophagitis presence not specified    ED Discharge Orders    None       Janeece Fitting, PA-C 10/26/18 1647    Long, Wonda Olds, MD 10/26/18 2032

## 2018-10-26 NOTE — ED Triage Notes (Signed)
Glenn Ball is atype 1 diabetic who presents with generalized body aches that move for the past 2 months. HE has been seen for this before. HE reports pain in in left arm and shoulder that is not improved with rest and comes and goes, the same pain is also in his lower back and bilateral legs that comes and goes. HE says he also has to clear his throat a lot and has problems with acid reflux a "a minute" He reports that his pain score at this time is a 7. His sugars have been running 50s-300s.

## 2018-11-01 ENCOUNTER — Encounter: Payer: Self-pay | Admitting: Family Medicine

## 2018-11-01 ENCOUNTER — Other Ambulatory Visit: Payer: Self-pay | Admitting: Family Medicine

## 2018-11-01 ENCOUNTER — Other Ambulatory Visit: Payer: Self-pay

## 2018-11-01 ENCOUNTER — Ambulatory Visit (INDEPENDENT_AMBULATORY_CARE_PROVIDER_SITE_OTHER): Payer: Self-pay | Admitting: Family Medicine

## 2018-11-01 VITALS — BP 127/65 | HR 67 | Temp 98.5°F | Resp 14 | Ht 69.0 in | Wt 152.0 lb

## 2018-11-01 DIAGNOSIS — K219 Gastro-esophageal reflux disease without esophagitis: Secondary | ICD-10-CM

## 2018-11-01 DIAGNOSIS — E109 Type 1 diabetes mellitus without complications: Secondary | ICD-10-CM

## 2018-11-01 LAB — POCT URINALYSIS DIPSTICK
Bilirubin, UA: NEGATIVE
Blood, UA: NEGATIVE
Glucose, UA: POSITIVE — AB
Ketones, UA: NEGATIVE
Leukocytes, UA: NEGATIVE
Nitrite, UA: NEGATIVE
Protein, UA: NEGATIVE
Spec Grav, UA: 1.02 (ref 1.010–1.025)
Urobilinogen, UA: 0.2 E.U./dL
pH, UA: 7 (ref 5.0–8.0)

## 2018-11-01 LAB — POCT GLYCOSYLATED HEMOGLOBIN (HGB A1C): Hemoglobin A1C: 7.4 % — AB (ref 4.0–5.6)

## 2018-11-01 LAB — GLUCOSE, POCT (MANUAL RESULT ENTRY): POC Glucose: 205 mg/dl — AB (ref 70–99)

## 2018-11-01 MED ORDER — TRUE METRIX BLOOD GLUCOSE TEST VI STRP: ORAL_STRIP | 11 refills | Status: DC

## 2018-11-01 MED ORDER — OMEPRAZOLE 40 MG PO CPDR: 40.0000 mg | DELAYED_RELEASE_CAPSULE | Freq: Every day | ORAL | 2 refills | Status: DC

## 2018-11-01 MED ORDER — LANTUS SOLOSTAR 100 UNIT/ML ~~LOC~~ SOPN: 35.0000 [IU] | PEN_INJECTOR | Freq: Every day | SUBCUTANEOUS | 3 refills | Status: DC

## 2018-11-01 MED ORDER — LANTUS SOLOSTAR 100 UNIT/ML ~~LOC~~ SOPN: 30.0000 [IU] | PEN_INJECTOR | Freq: Every day | SUBCUTANEOUS | 3 refills | Status: DC

## 2018-11-01 MED ORDER — LANTUS SOLOSTAR 100 UNIT/ML ~~LOC~~ SOPN: 25.0000 [IU] | PEN_INJECTOR | Freq: Every day | SUBCUTANEOUS | 3 refills | Status: DC

## 2018-11-01 MED ORDER — INSULIN LISPRO 100 UNIT/ML ~~LOC~~ SOLN: SUBCUTANEOUS | 3 refills | Status: DC

## 2018-11-01 MED FILL — TRUE METRIX TEST STRIP: 25 days supply | Qty: 100 | Fill #0

## 2018-11-01 MED FILL — OMEPRAZOLE DR 40 MG CAPSULE: 40 | 30 days supply | Qty: 30 | Fill #0

## 2018-11-01 MED FILL — $Humalog 100u/ml vial: 100 | 66 days supply | Qty: 30 | Fill #1

## 2018-11-01 NOTE — Progress Notes (Signed)
9

## 2018-11-01 NOTE — Progress Notes (Signed)
[facility]   Progress Note: General Provider: Mike GipAndre Aniaya Bacha, FNP  SUBJECTIVE:   Glenn Ball is a 28 y.o. male who  has a past medical history of Depression and Diabetes mellitus.. Patient presents today for Diabetes He reports not eating well. He states that he has been drinking sweet green tea and eating greasy fried foods. He was seen in the ED on 05/26/2018 due to chest pain. Diagnosed with GERD and started on omeprazole 40mg . Patient reports compliance with his medications. He states that the omeprazole does not completely relieve the GERD symptoms.   Review of Systems  Constitutional: Negative.   HENT: Negative.   Eyes: Negative.   Respiratory: Negative.   Cardiovascular: Negative.   Gastrointestinal: Positive for heartburn.  Genitourinary: Negative.   Musculoskeletal: Negative.   Skin: Negative.   Neurological: Negative.   Psychiatric/Behavioral: Negative.      OBJECTIVE: BP 127/65 (BP Location: Left Arm, Patient Position: Sitting, Cuff Size: Normal)   Pulse 67   Temp 98.5 F (36.9 C) (Oral)   Resp 14   Ht 5\' 9"  (1.753 m)   Wt 152 lb (68.9 kg)   SpO2 99%   BMI 22.45 kg/m   Wt Readings from Last 3 Encounters:  11/01/18 152 lb (68.9 kg)  10/26/18 151 lb (68.5 kg)  08/02/18 155 lb (70.3 kg)     Physical Exam Vitals signs and nursing note reviewed.  Constitutional:      General: He is not in acute distress.    Appearance: Normal appearance.  HENT:     Head: Normocephalic and atraumatic.  Eyes:     Extraocular Movements: Extraocular movements intact.     Conjunctiva/sclera: Conjunctivae normal.     Pupils: Pupils are equal, round, and reactive to light.  Cardiovascular:     Rate and Rhythm: Normal rate and regular rhythm.     Heart sounds: No murmur.  Pulmonary:     Effort: Pulmonary effort is normal.     Breath sounds: Normal breath sounds.  Abdominal:     Tenderness: There is abdominal tenderness  (epigastric).  Musculoskeletal: Normal range of motion.  Skin:    General: Skin is warm and dry.  Neurological:     Mental Status: He is alert and oriented to person, place, and time.  Psychiatric:        Mood and Affect: Mood normal.        Behavior: Behavior normal.        Thought Content: Thought content normal.        Judgment: Judgment normal.     ASSESSMENT/PLAN:   1. Type 1 diabetes mellitus without complication (HCC) We discussed increasing lantus to 35 units daily. He would like to try 30 units daily and eat a healthier diet.  - Urinalysis Dipstick - HgB A1c - Glucose (CBG) - glucose blood (TRUE METRIX BLOOD GLUCOSE TEST) test strip; Check blood sugars 3 times per day prior to meals and at bedtime. Use as instructed  Dispense: 200 each; Refill: 11 - insulin lispro (HUMALOG) 100 UNIT/ML injection  Dispense: 30 mL; Refill: 3 - Insulin Glargine (LANTUS SOLOSTAR) 100 UNIT/ML Solostar Pen; Inject 25 Units into the skin daily at 10 pm.  Dispense: 15 mL; Refill: 3  2. Gastroesophageal reflux disease without esophagitis We discussed foods that exaccerbate GERD symptoms. Explained the MOA of PPIs, benefits of medications and risks of uncontrolled GERD.  - omeprazole (PRILOSEC) 40 MG capsule; Take 1 capsule (40 mg  total) by mouth daily.  Dispense: 30 capsule; Refill: 2    Return in about 3 months (around 02/01/2019) for DM1.    The patient was given clear instructions to go to ER or return to medical center if symptoms do not improve, worsen or new problems develop. The patient verbalized understanding and agreed with plan of care.   Ms. Doug Sou. Nathaneil Canary, FNP-BC [facility] [address] [place], [place] [address] [phone]

## 2018-11-01 NOTE — Patient Instructions (Addendum)
I am increasing your Lantus to 35 units daily. This is an increase from 25 units.   Diabetes Mellitus and Nutrition, Adult When you have diabetes (diabetes mellitus), it is very important to have healthy eating habits because your blood sugar (glucose) levels are greatly affected by what you eat and drink. Eating healthy foods in the appropriate amounts, at about the same times every day, can help you:  Control your blood glucose.  Lower your risk of heart disease.  Improve your blood pressure.  Reach or maintain a healthy weight. Every person with diabetes is different, and each person has different needs for a meal plan. Your health care provider may recommend that you work with a diet and nutrition specialist (dietitian) to make a meal plan that is best for you. Your meal plan may vary depending on factors such as:  The calories you need.  The medicines you take.  Your weight.  Your blood glucose, blood pressure, and cholesterol levels.  Your activity level.  Other health conditions you have, such as heart or kidney disease. How do carbohydrates affect me? Carbohydrates, also called carbs, affect your blood glucose level more than any other type of food. Eating carbs naturally raises the amount of glucose in your blood. Carb counting is a method for keeping track of how many carbs you eat. Counting carbs is important to keep your blood glucose at a healthy level, especially if you use insulin or take certain oral diabetes medicines. It is important to know how many carbs you can safely have in each meal. This is different for every person. Your dietitian can help you calculate how many carbs you should have at each meal and for each snack. Foods that contain carbs include:  Bread, cereal, rice, pasta, and crackers.  Potatoes and corn.  Peas, beans, and lentils.  Milk and yogurt.  Fruit and juice.  Desserts, such as cakes, cookies, ice cream, and candy. How does alcohol  affect me? Alcohol can cause a sudden decrease in blood glucose (hypoglycemia), especially if you use insulin or take certain oral diabetes medicines. Hypoglycemia can be a life-threatening condition. Symptoms of hypoglycemia (sleepiness, dizziness, and confusion) are similar to symptoms of having too much alcohol. If your health care provider says that alcohol is safe for you, follow these guidelines:  Limit alcohol intake to no more than 1 drink per day for nonpregnant women and 2 drinks per day for men. One drink equals 12 oz of beer, 5 oz of wine, or 1 oz of hard liquor.  Do not drink on an empty stomach.  Keep yourself hydrated with water, diet soda, or unsweetened iced tea.  Keep in mind that regular soda, juice, and other mixers may contain a lot of sugar and must be counted as carbs. What are tips for following this plan?  Reading food labels  Start by checking the serving size on the "Nutrition Facts" label of packaged foods and drinks. The amount of calories, carbs, fats, and other nutrients listed on the label is based on one serving of the item. Many items contain more than one serving per package.  Check the total grams (g) of carbs in one serving. You can calculate the number of servings of carbs in one serving by dividing the total carbs by 15. For example, if a food has 30 g of total carbs, it would be equal to 2 servings of carbs.  Check the number of grams (g) of saturated and trans fats in  one serving. Choose foods that have low or no amount of these fats.  Check the number of milligrams (mg) of salt (sodium) in one serving. Most people should limit total sodium intake to less than 2,300 mg per day.  Always check the nutrition information of foods labeled as "low-fat" or "nonfat". These foods may be higher in added sugar or refined carbs and should be avoided.  Talk to your dietitian to identify your daily goals for nutrients listed on the label. Shopping  Avoid buying  canned, premade, or processed foods. These foods tend to be high in fat, sodium, and added sugar.  Shop around the outside edge of the grocery store. This includes fresh fruits and vegetables, bulk grains, fresh meats, and fresh dairy. Cooking  Use low-heat cooking methods, such as baking, instead of high-heat cooking methods like deep frying.  Cook using healthy oils, such as olive, canola, or sunflower oil.  Avoid cooking with butter, cream, or high-fat meats. Meal planning  Eat meals and snacks regularly, preferably at the same times every day. Avoid going long periods of time without eating.  Eat foods high in fiber, such as fresh fruits, vegetables, beans, and whole grains. Talk to your dietitian about how many servings of carbs you can eat at each meal.  Eat 4-6 ounces (oz) of lean protein each day, such as lean meat, chicken, fish, eggs, or tofu. One oz of lean protein is equal to: ? 1 oz of meat, chicken, or fish. ? 1 egg. ?  cup of tofu.  Eat some foods each day that contain healthy fats, such as avocado, nuts, seeds, and fish. Lifestyle  Check your blood glucose regularly.  Exercise regularly as told by your health care provider. This may include: ? 150 minutes of moderate-intensity or vigorous-intensity exercise each week. This could be brisk walking, biking, or water aerobics. ? Stretching and doing strength exercises, such as yoga or weightlifting, at least 2 times a week.  Take medicines as told by your health care provider.  Do not use any products that contain nicotine or tobacco, such as cigarettes and e-cigarettes. If you need help quitting, ask your health care provider.  Work with a Social worker or diabetes educator to identify strategies to manage stress and any emotional and social challenges. Questions to ask a health care provider  Do I need to meet with a diabetes educator?  Do I need to meet with a dietitian?  What number can I call if I have  questions?  When are the best times to check my blood glucose? Where to find more information:  American Diabetes Association: diabetes.org  Academy of Nutrition and Dietetics: www.eatright.[employer] of Diabetes and Digestive and Kidney Diseases (NIH): [employer] Summary  A healthy meal plan will help you control your blood glucose and maintain a healthy lifestyle.  Working with a diet and nutrition specialist (dietitian) can help you make a meal plan that is best for you.  Keep in mind that carbohydrates (carbs) and alcohol have immediate effects on your blood glucose levels. It is important to count carbs and to use alcohol carefully. This information is not intended to replace advice given to you by your health care provider. Make sure you discuss any questions you have with your health care provider. Document Released: 12/01/2004 Document Revised: 02/16/2017 Document Reviewed: 04/10/2016 Elsevier Patient Education  2020 [place] for Gastroesophageal Reflux Disease, Adult When you have gastroesophageal reflux disease (GERD), the foods you  eat and your eating habits are very important. Choosing the right foods can help ease your discomfort. Think about working with a nutrition specialist (dietitian) to help you make good choices. What are tips for following this plan?  Meals  Choose healthy foods that are low in fat, such as fruits, vegetables, whole grains, low-fat dairy products, and lean meat, fish, and poultry.  Eat small meals often instead of 3 large meals a day. Eat your meals slowly, and in a place where you are relaxed. Avoid bending over or lying down until 2-3 hours after eating.  Avoid eating meals 2-3 hours before bed.  Avoid drinking a lot of liquid with meals.  Cook foods using methods other than frying. Bake, grill, or broil food instead.  Avoid or limit: ? Chocolate. ? Peppermint or spearmint. ? Alcohol. ? Pepper. ?  Black and decaffeinated coffee. ? Black and decaffeinated tea. ? Bubbly (carbonated) soft drinks. ? Caffeinated energy drinks and soft drinks.  Limit high-fat foods such as: ? Fatty meat or fried foods. ? Whole milk, cream, butter, or ice cream. ? Nuts and nut butters. ? Pastries, donuts, and sweets made with butter or shortening.  Avoid foods that cause symptoms. These foods may be different for everyone. Common foods that cause symptoms include: ? Tomatoes. ? Oranges, lemons, and limes. ? Peppers. ? Spicy food. ? Onions and garlic. ? Vinegar. Lifestyle  Maintain a healthy weight. Ask your doctor what weight is healthy for you. If you need to lose weight, work with your doctor to do so safely.  Exercise for at least 30 minutes for 5 or more days each week, or as told by your doctor.  Wear loose-fitting clothes.  Do not smoke. If you need help quitting, ask your doctor.  Sleep with the head of your bed higher than your feet. Use a wedge under the mattress or blocks under the bed frame to raise the head of the bed. Summary  When you have gastroesophageal reflux disease (GERD), food and lifestyle choices are very important in easing your symptoms.  Eat small meals often instead of 3 large meals a day. Eat your meals slowly, and in a place where you are relaxed.  Limit high-fat foods such as fatty meat or fried foods.  Avoid bending over or lying down until 2-3 hours after eating.  Avoid peppermint and spearmint, caffeine, alcohol, and chocolate. This information is not intended to replace advice given to you by your health care provider. Make sure you discuss any questions you have with your health care provider. Document Released: 09/05/2011 Document Revised: 06/27/2018 Document Reviewed: 04/11/2016 Elsevier Patient Education  2020 [employer]

## 2018-11-27 ENCOUNTER — Encounter (HOSPITAL_COMMUNITY): Payer: Self-pay | Admitting: *Deleted

## 2018-11-27 ENCOUNTER — Encounter (HOSPITAL_COMMUNITY): Payer: Self-pay

## 2019-01-31 ENCOUNTER — Ambulatory Visit: Payer: Self-pay | Admitting: Family Medicine

## 2019-02-07 ENCOUNTER — Other Ambulatory Visit: Payer: Self-pay

## 2019-02-07 DIAGNOSIS — E109 Type 1 diabetes mellitus without complications: Secondary | ICD-10-CM

## 2019-02-07 MED ORDER — LANTUS SOLOSTAR 100 UNIT/ML ~~LOC~~ SOPN: 35.0000 [IU] | PEN_INJECTOR | Freq: Every day | SUBCUTANEOUS | 3 refills | Status: DC

## 2019-02-07 MED ORDER — TRUE METRIX BLOOD GLUCOSE TEST VI STRP: ORAL_STRIP | 11 refills | Status: DC

## 2019-02-07 MED FILL — TRUE METRIX TEST STRIP: 25 days supply | Qty: 100 | Fill #0

## 2019-02-07 MED FILL — $LANTUS SOLOSTAR 100 UNITS/: 100 | 34 days supply | Qty: 12 | Fill #0

## 2019-02-19 MED FILL — $LANTUS SOLOSTAR 100 UNITS/: 100 | 34 days supply | Qty: 12 | Fill #0

## 2019-02-19 MED FILL — TRUE METRIX TEST STRIP: 25 days supply | Qty: 100 | Fill #0

## 2019-02-28 ENCOUNTER — Other Ambulatory Visit: Payer: Self-pay

## 2019-02-28 ENCOUNTER — Encounter: Payer: Self-pay | Admitting: Family Medicine

## 2019-02-28 ENCOUNTER — Ambulatory Visit (INDEPENDENT_AMBULATORY_CARE_PROVIDER_SITE_OTHER): Payer: Self-pay | Admitting: Family Medicine

## 2019-02-28 VITALS — BP 106/60 | HR 92 | Temp 97.9°F | Ht 69.0 in | Wt 149.8 lb

## 2019-02-28 DIAGNOSIS — Z09 Encounter for follow-up examination after completed treatment for conditions other than malignant neoplasm: Secondary | ICD-10-CM

## 2019-02-28 DIAGNOSIS — R7309 Other abnormal glucose: Secondary | ICD-10-CM

## 2019-02-28 DIAGNOSIS — K219 Gastro-esophageal reflux disease without esophagitis: Secondary | ICD-10-CM

## 2019-02-28 DIAGNOSIS — E109 Type 1 diabetes mellitus without complications: Secondary | ICD-10-CM

## 2019-02-28 LAB — POCT URINALYSIS DIPSTICK
Bilirubin, UA: NEGATIVE
Blood, UA: NEGATIVE
Glucose, UA: NEGATIVE
Ketones, UA: NEGATIVE
Leukocytes, UA: NEGATIVE
Nitrite, UA: NEGATIVE
Protein, UA: NEGATIVE
Spec Grav, UA: 1.015 (ref 1.010–1.025)
Urobilinogen, UA: 0.2 E.U./dL
pH, UA: 7 (ref 5.0–8.0)

## 2019-02-28 LAB — GLUCOSE, POCT (MANUAL RESULT ENTRY)
POC Glucose: 43 mg/dl — AB (ref 70–99)
POC Glucose: 148 mg/dl — AB (ref 70–99)

## 2019-02-28 LAB — POCT GLYCOSYLATED HEMOGLOBIN (HGB A1C): Hemoglobin A1C: 6.8 % — AB (ref 4.0–5.6)

## 2019-02-28 NOTE — Progress Notes (Signed)
Patient [facility]   Established Patient Office Visit  Subjective:  Patient ID: Glenn Ball, male    DOB: 1990-04-18  Age: 28 y.o. MRN: [id number]  CC:  Chief Complaint  Patient presents with  . Follow-up    Former Venora Maples NP patient- DM    HP Glenn Ball is a 28 year old male who presents for Follow Up today.   Past Medical History:  Diagnosis Date  . Depression   . Diabetes mellitus    Current Status: Previous patient of Lanae Boast, NP. Since his last office visit, he is doing well with no complaints. He has c/o acid reflux X 8 months. He has not taken any medication for relief. His most recent normal range of preprandial blood glucose levels have been between 70-120 . He has seen low range of 43, which he eats sweets to bring blood glucose levels up; and high of 300 since his last office visit. He denies fatigue, frequent urination, blurred vision, excessive hunger, excessive thirst, weight gain, weight loss, and poor wound healing. He continues to check his feet regularly. His anxiety is mild today. He denies suicidal ideations, homicidal ideations, or auditory hallucinations. He denies fevers, chills, fatigue, recent infections, weight loss, and night sweats. He has not had any headaches, visual changes, dizziness, and falls. No chest pain, heart palpitations, cough and shortness of breath reported. No reports of GI problems such as nausea, vomiting, diarrhea, and constipation. He has no reports of blood in stools, dysuria and hematuria. He denies pain today.   History reviewed. No pertinent surgical history.  Family History  Problem Relation Age of Onset  . Hypertension Mother   . Cancer Maternal Grandmother   . Hypertension Maternal Grandfather   . Diabetes Maternal Grandfather     Social History   Socioeconomic History  . Marital status: Single    Spouse name: Not on file  . Number of children: Not on file  . Years of  education: Not on file  . Highest education level: Not on file  Occupational History  . Not on file  Tobacco Use  . Smoking status: Former Research scientist (life sciences)  . Smokeless tobacco: Never Used  Substance and Sexual Activity  . Alcohol use: Not Currently    Comment: occ  . Drug use: Yes    Types: Marijuana    Comment: 1-2 times a month. occ  . Sexual activity: Yes  Other Topics Concern  . Not on file  Social History Narrative  . Not on file   Social Determinants of Health   Financial Resource Strain:   . Difficulty of Paying Living Expenses: Not on file  Food Insecurity:   . Worried About Charity fundraiser in the Last Year: Not on file  . Ran Out of Food in the Last Year: Not on file  Transportation Needs:   . Lack of Transportation (Medical): Not on file  . Lack of Transportation (Non-Medical): Not on file  Physical Activity:   . Days of Exercise per Week: Not on file  . Minutes of Exercise per Session: Not on file  Stress:   . Feeling of Stress : Not on file  Social Connections:   . Frequency of Communication with Friends and Family: Not on file  . Frequency of Social Gatherings with Friends and Family: Not on file  . Attends Religious Services: Not on file  . Active Member of Clubs or Organizations: Not on file  .  Attends Archivist Meetings: Not on file  . Marital Status: Not on file  Intimate Partner Violence:   . Fear of Current or Ex-Partner: Not on file  . Emotionally Abused: Not on file  . Physically Abused: Not on file  . Sexually Abused: Not on file    Outpatient Medications Prior to Visit  Medication Sig Dispense Refill  . Blood Glucose Monitoring Suppl (TRUE METRIX AIR GLUCOSE METER) w/Device KIT 1 each by Does not apply route 4 (four) times daily -  with meals and at bedtime. 1 kit 0  . glucose blood (TRUE METRIX BLOOD GLUCOSE TEST) test strip Check blood sugars 3 times per day prior to meals and at bedtime. Use as instructed 200 each 11  . Insulin  Glargine (LANTUS SOLOSTAR) 100 UNIT/ML Solostar Pen Inject 35 Units into the skin at bedtime. 4 pen 3  . insulin lispro (HUMALOG) 100 UNIT/ML injection INJECT 0-0.15 MLS (0-15 UNITS TOTAL) INTO THE SKIN 3 (THREE) TIMES DAILY BEFORE MEALS. 30 mL 3  . Lancet Device MISC 1 each by Does not apply route 4 (four) times daily -  before meals and at bedtime. 100 each 11  . triamcinolone (KENALOG) 0.025 % ointment Apply 1 application topically 2 (two) times daily. 60 g 0  . TRUEPLUS LANCETS 28G MISC 1 each by Does not apply route 4 (four) times daily. 100 each 11  . omeprazole (PRILOSEC) 40 MG capsule TAKE 1 CAPSULE (40 MG TOTAL) BY MOUTH DAILY. (Patient not taking: Reported on 02/28/2019) 30 capsule 2  . Blood Glucose Monitoring Suppl (TRUE METRIX AIR GLUCOSE METER) w/Device KIT 1 each by Does not apply route 4 (four) times daily -  with meals and at bedtime. 1 kit 0  . UNABLE TO FIND Med Name: 120 syringes and needles for insulin 120 Units 0  . UNABLE TO FIND Please excuse Mr. Glenn Ball from any work or school obligations from 10/13/2011 till 10/29/2011 as he was hospitalized. 1 Units 0   No facility-administered medications prior to visit.    Allergies  Allergen Reactions  . Sulfa Antibiotics     "just knows he's allergic"  . Sulfasalazine     "just knows he's allergic"    ROS Review of Systems  Constitutional: Negative.   HENT: Negative.   Eyes: Negative.   Respiratory: Negative.   Cardiovascular: Negative.   Gastrointestinal: Negative.   Endocrine: Negative.   Genitourinary: Negative.   Musculoskeletal: Negative.   Skin: Negative.   Allergic/Immunologic: Negative.   Neurological: Negative.   Hematological: Negative.   Psychiatric/Behavioral: Negative.       Objective:    Physical Exam  Constitutional: He is oriented to person, place, and time. He appears well-developed and well-nourished.  HENT:  Head: Normocephalic and atraumatic.  Eyes: Conjunctivae are normal.    Cardiovascular: Normal rate, regular rhythm, normal heart sounds and intact distal pulses.  Pulmonary/Chest: Breath sounds normal.  Abdominal: Soft. Bowel sounds are normal.  Musculoskeletal:        General: Normal range of motion.     Cervical back: Normal range of motion and neck supple.  Neurological: He is alert and oriented to person, place, and time. He has normal reflexes.  Skin: Skin is warm and dry.  Psychiatric: He has a normal mood and affect. His behavior is normal. Judgment and thought content normal.  Nursing note and vitals reviewed.   BP 106/60   Pulse 92   Temp 97.9 F (36.6 C)   Ht  '5\' 9"'  (1.753 m)   Wt 149 lb 12.8 oz (67.9 kg)   SpO2 98%   BMI 22.12 kg/m  Wt Readings from Last 3 Encounters:  02/28/19 149 lb 12.8 oz (67.9 kg)  11/01/18 152 lb (68.9 kg)  10/26/18 151 lb (68.5 kg)     Health Maintenance Due  Topic Date Due  . OPHTHALMOLOGY EXAM  06/18/2000  . FOOT EXAM  12/29/2018  . URINE MICROALBUMIN  12/29/2018    There are no preventive care reminders to display for this patient.  Lab Results  Component Value Date   TSH 0.894 07/25/2018   Lab Results  Component Value Date   WBC 5.3 10/26/2018   HGB 15.9 10/26/2018   HCT 48.5 10/26/2018   MCV 84.3 10/26/2018   PLT 194 10/26/2018   Lab Results  Component Value Date   NA 135 10/26/2018   K 4.4 10/26/2018   CO2 25 10/26/2018   GLUCOSE 205 (H) 10/26/2018   BUN 15 10/26/2018   CREATININE 1.10 10/26/2018   BILITOT 2.9 (H) 10/26/2018   ALKPHOS 62 10/26/2018   AST 18 10/26/2018   ALT 11 10/26/2018   PROT 7.0 10/26/2018   ALBUMIN 4.2 10/26/2018   CALCIUM 9.2 10/26/2018   ANIONGAP 10 10/26/2018   No results found for: CHOL No results found for: HDL No results found for: LDLCALC No results found for: TRIG No results found for: CHOLHDL Lab Results  Component Value Date   HGBA1C 6.8 (A) 02/28/2019      Assessment & Plan:   1. Type 1 diabetes mellitus without complication  (HCC) Blood glucose at 148 today. He will continue medication as prescribed, to decrease foods/beverages high in sugars and carbs and follow Heart Healthy or DASH diet. Increase physical activity to at least 30 minutes cardio exercise daily.  - Urinalysis Dipstick - POCT HgB A1C - Glucose (CBG) - Glucose (CBG)  2. Hemoglobin A1c less than 7.0%  Hgb A1c at 6.8 today.   3. Gastroesophageal reflux disease without esophagitis We will plan for him to return in 2 weeks for H. Pylori.  4. Follow up Follow up in 3 months for office visit. Follow up in 2 weeks for H.Pylori testing.   No orders of the defined types were placed in this encounter.   Orders Placed This Encounter  Procedures  . Urinalysis Dipstick  . POCT HgB A1C  . Glucose (CBG)  . Glucose (CBG)    Referral Orders  No referral(s) requested today    Kathe Becton,  MSN, FNP-BC [place], [place] [address] [phone]- fax  Problem List Items Addressed This Visit      Endocrine   Type 1 diabetes mellitus without complication ([place]) - Primary   Relevant Orders   Urinalysis Dipstick (Completed)   POCT HgB A1C (Completed)   Glucose (CBG) (Completed)   Glucose (CBG) (Completed)    Other Visit Diagnoses    Hemoglobin A1c less than 7.0%       Gastroesophageal reflux disease without esophagitis       Follow up          No orders of the defined types were placed in this encounter.   Follow-up: No follow-ups on file.    Azzie Glatter, FNP

## 2019-03-02 DIAGNOSIS — R7309 Other abnormal glucose: Secondary | ICD-10-CM | POA: Insufficient documentation

## 2019-03-02 DIAGNOSIS — K219 Gastro-esophageal reflux disease without esophagitis: Secondary | ICD-10-CM | POA: Insufficient documentation

## 2019-03-17 ENCOUNTER — Ambulatory Visit: Payer: Self-pay

## 2019-03-17 ENCOUNTER — Other Ambulatory Visit: Payer: Self-pay

## 2019-03-17 DIAGNOSIS — K219 Gastro-esophageal reflux disease without esophagitis: Secondary | ICD-10-CM

## 2019-03-19 LAB — H. PYLORI BREATH TEST: H pylori Breath Test: NEGATIVE

## 2019-04-04 ENCOUNTER — Ambulatory Visit: Payer: Self-pay | Attending: Internal Medicine

## 2019-04-04 DIAGNOSIS — Z20822 Contact with and (suspected) exposure to covid-19: Secondary | ICD-10-CM

## 2019-04-06 LAB — NOVEL CORONAVIRUS, NAA: SARS-CoV-2, NAA: NOT DETECTED

## 2019-04-17 MED FILL — $Humalog 100u/ml vial: 100 | 66 days supply | Qty: 30 | Fill #2

## 2019-04-17 MED FILL — $LANTUS SOLOSTAR 100 UNITS/: 100 | 34 days supply | Qty: 12 | Fill #1

## 2019-04-17 MED FILL — TRUE METRIX TEST STRIP: 25 days supply | Qty: 100 | Fill #1

## 2019-05-16 MED FILL — TRUE METRIX TEST STRIP: 50 days supply | Qty: 200 | Fill #2

## 2019-05-16 MED FILL — $LANTUS SOLOSTAR 100 UNITS/: 100 | 25 days supply | Qty: 9 | Fill #2

## 2019-05-27 ENCOUNTER — Telehealth: Payer: Self-pay | Admitting: Family Medicine

## 2019-05-27 NOTE — Telephone Encounter (Signed)
Pt was called and reminded of there appointment 

## 2019-05-28 ENCOUNTER — Encounter: Payer: Self-pay | Admitting: Family Medicine

## 2019-05-28 ENCOUNTER — Other Ambulatory Visit: Payer: Self-pay

## 2019-05-28 ENCOUNTER — Ambulatory Visit (INDEPENDENT_AMBULATORY_CARE_PROVIDER_SITE_OTHER): Payer: Self-pay | Admitting: Family Medicine

## 2019-05-28 VITALS — BP 116/60 | HR 62 | Temp 98.3°F | Ht 69.0 in | Wt 150.0 lb

## 2019-05-28 DIAGNOSIS — K219 Gastro-esophageal reflux disease without esophagitis: Secondary | ICD-10-CM

## 2019-05-28 DIAGNOSIS — E109 Type 1 diabetes mellitus without complications: Secondary | ICD-10-CM

## 2019-05-28 DIAGNOSIS — Z09 Encounter for follow-up examination after completed treatment for conditions other than malignant neoplasm: Secondary | ICD-10-CM

## 2019-05-28 DIAGNOSIS — R7309 Other abnormal glucose: Secondary | ICD-10-CM

## 2019-05-28 LAB — POCT URINALYSIS DIPSTICK
Bilirubin, UA: NEGATIVE
Blood, UA: NEGATIVE
Glucose, UA: POSITIVE — AB
Ketones, UA: NEGATIVE
Nitrite, UA: NEGATIVE
Protein, UA: NEGATIVE
Spec Grav, UA: >=1.03 — AB (ref 1.010–1.025)
Urobilinogen, UA: 0.2 E.U./dL
pH, UA: 7 (ref 5.0–8.0)

## 2019-05-28 LAB — POCT GLYCOSYLATED HEMOGLOBIN (HGB A1C): Hemoglobin A1C: 7.2 % — AB (ref 4.0–5.6)

## 2019-05-28 LAB — GLUCOSE, POCT (MANUAL RESULT ENTRY): POC Glucose: 235 mg/dl — AB (ref 70–99)

## 2019-05-28 NOTE — Progress Notes (Signed)
Patient [facility]    Established Patient Office Visit  Subjective:  Patient ID: Glenn Ball, male    DOB: [birth year]  Age: 29 y.o. MRN: [id number]  CC:  Chief Complaint  Patient presents with  . Follow-up    DM1    HPI Glenn Ball is a 29 year old male who presents for Follow Up today.   Past Medical History:  Diagnosis Date  . Depression   . Diabetes mellitus    Current Status: Since his last office visit, he is doing well with no complaints. He continues to have acid reflux occasionally, as he has been careful with food choices. He is requesting to have paperwork completed for FMLA for his Diabetes. His most recent normal range of preprandial blood glucose levels have been between 90-130. He has seen low range of 46, which he ate a small carb and range returned to normal;  and high of 400 since his last office visit. Patient is type 1 diabetes. He denies fatigue, frequent urination, blurred vision, excessive hunger, excessive thirst, weight gain, weight loss, and poor wound healing. He continues to check his feet regularly. He denies fevers, chills, recent infections, weight loss, and night sweats. He has not had any headaches, dizziness, and falls. No chest pain, heart palpitations, cough and shortness of breath reported. No reports of GI problems such as diarrhea, and constipation. He has no reports of blood in stools, dysuria and hematuria. No depression or anxiety reported today. He denies suicidal ideations, homicidal ideations, or auditory hallucinations. He denies pain today.   History reviewed. No pertinent surgical history.  Family History  Problem Relation Age of Onset  . Hypertension Mother   . Cancer Maternal Grandmother   . Hypertension Maternal Grandfather   . Diabetes Maternal Grandfather     Social History   Socioeconomic History  . Marital status: Single    Spouse name: Not on file  . Number of children: Not on  file  . Years of education: Not on file  . Highest education level: Not on file  Occupational History  . Not on file  Tobacco Use  . Smoking status: Former Research scientist (life sciences)  . Smokeless tobacco: Never Used  Substance and Sexual Activity  . Alcohol use: Not Currently    Comment: occ  . Drug use: Yes    Types: Marijuana    Comment: 1-2 times a month. occ  . Sexual activity: Yes  Other Topics Concern  . Not on file  Social History Narrative  . Not on file   Social Determinants of Health   Financial Resource Strain:   . Difficulty of Paying Living Expenses: Not on file  Food Insecurity:   . Worried About Charity fundraiser in the Last Year: Not on file  . Ran Out of Food in the Last Year: Not on file  Transportation Needs:   . Lack of Transportation (Medical): Not on file  . Lack of Transportation (Non-Medical): Not on file  Physical Activity:   . Days of Exercise per Week: Not on file  . Minutes of Exercise per Session: Not on file  Stress:   . Feeling of Stress : Not on file  Social Connections:   . Frequency of Communication with Friends and Family: Not on file  . Frequency of Social Gatherings with Friends and Family: Not on file  . Attends Religious Services: Not on file  . Active Member of Clubs or Organizations: Not on  file  . Attends Archivist Meetings: Not on file  . Marital Status: Not on file  Intimate Partner Violence:   . Fear of Current or Ex-Partner: Not on file  . Emotionally Abused: Not on file  . Physically Abused: Not on file  . Sexually Abused: Not on file    Outpatient Medications Prior to Visit  Medication Sig Dispense Refill  . Blood Glucose Monitoring Suppl (GLUCOCOM BLOOD GLUCOSE MONITOR) DEVI accu check machine with lancets and strips.    . Blood Glucose Monitoring Suppl (TRUE METRIX AIR GLUCOSE METER) w/Device KIT 1 each by Does not apply route 4 (four) times daily -  with meals and at bedtime. 1 kit 0  . glucose blood (ONETOUCH VERIO)  test strip by Other route Two (2) times a day. E11.9    . glucose blood (TRUE METRIX BLOOD GLUCOSE TEST) test strip Check blood sugars 3 times per day prior to meals and at bedtime. Use as instructed 200 each 11  . Insulin Glargine (LANTUS SOLOSTAR) 100 UNIT/ML Solostar Pen Inject 35 Units into the skin at bedtime. 4 pen 3  . insulin glargine (LANTUS) 100 UNIT/ML injection INJECT 20 UNITS EVERY MORNING AND 10 UNITS EVERY EVENING    . insulin lispro (HUMALOG) 100 UNIT/ML injection INJECT 0-0.15 MLS (0-15 UNITS TOTAL) INTO THE SKIN 3 (THREE) TIMES DAILY BEFORE MEALS. 30 mL 3  . Lancet Device MISC 1 each by Does not apply route 4 (four) times daily -  before meals and at bedtime. 100 each 11  . omeprazole (PRILOSEC) 40 MG capsule TAKE 1 CAPSULE (40 MG TOTAL) BY MOUTH DAILY. 30 capsule 2  . triamcinolone (KENALOG) 0.025 % ointment Apply 1 application topically 2 (two) times daily. 60 g 0  . TRUEPLUS LANCETS 28G MISC 1 each by Does not apply route 4 (four) times daily. 100 each 11  . insulin aspart (NOVOLOG) 100 UNIT/ML injection INJECT 4 UNITS UNDER THE SKIN AS DIRECTED BY SLIDING SCALE     No facility-administered medications prior to visit.    Allergies  Allergen Reactions  . Sulfa Antibiotics     "just knows he's allergic"  . Sulfasalazine     "just knows he's allergic"    ROS Review of Systems  Constitutional: Negative.   HENT: Negative.   Eyes: Negative.   Respiratory: Negative.   Cardiovascular: Negative.   Gastrointestinal: Negative.   Endocrine: Negative.   Genitourinary: Negative.   Musculoskeletal: Negative.   Skin: Negative.   Allergic/Immunologic: Negative.   Neurological: Negative.   Hematological: Negative.   Psychiatric/Behavioral: Negative.     Objective:    Physical Exam  Constitutional: He is oriented to person, place, and time. He appears well-developed and well-nourished.  HENT:  Head: Normocephalic and atraumatic.  Eyes: Conjunctivae are normal.    Cardiovascular: Normal rate, regular rhythm, normal heart sounds and intact distal pulses.  Pulmonary/Chest: Effort normal and breath sounds normal.  Abdominal: Soft. Bowel sounds are normal.  Musculoskeletal:        General: Normal range of motion.     Cervical back: Normal range of motion and neck supple.  Neurological: He is alert and oriented to person, place, and time. He has normal reflexes.  Skin: Skin is warm and dry.  Psychiatric: He has a normal mood and affect. His behavior is normal. Judgment and thought content normal.  Nursing note and vitals reviewed.   BP 116/60   Pulse 62   Temp 98.3 F (36.8 C) (Oral)  Ht '5\' 9"'  (1.753 m)   Wt 150 lb (68 kg)   SpO2 100%   BMI 22.15 kg/m  Wt Readings from Last 3 Encounters:  05/28/19 150 lb (68 kg)  02/28/19 149 lb 12.8 oz (67.9 kg)  11/01/18 152 lb (68.9 kg)     Health Maintenance Due  Topic Date Due  . OPHTHALMOLOGY EXAM  06/18/2000  . FOOT EXAM  12/29/2018  . URINE MICROALBUMIN  12/29/2018    There are no preventive care reminders to display for this patient.  Lab Results  Component Value Date   TSH 0.894 07/25/2018   Lab Results  Component Value Date   WBC 5.3 10/26/2018   HGB 15.9 10/26/2018   HCT 48.5 10/26/2018   MCV 84.3 10/26/2018   PLT 194 10/26/2018   Lab Results  Component Value Date   NA 135 10/26/2018   K 4.4 10/26/2018   CO2 25 10/26/2018   GLUCOSE 205 (H) 10/26/2018   BUN 15 10/26/2018   CREATININE 1.10 10/26/2018   BILITOT 2.9 (H) 10/26/2018   ALKPHOS 62 10/26/2018   AST 18 10/26/2018   ALT 11 10/26/2018   PROT 7.0 10/26/2018   ALBUMIN 4.2 10/26/2018   CALCIUM 9.2 10/26/2018   ANIONGAP 10 10/26/2018   No results found for: CHOL No results found for: HDL No results found for: LDLCALC No results found for: TRIG No results found for: CHOLHDL Lab Results  Component Value Date   HGBA1C 6.8 (A) 02/28/2019    Assessment & Plan:   1. Type 1 diabetes mellitus without complication  (HCC) Hgb Q2S is well controlled, with no complications.  - POCT glucose (manual entry) - POCT urinalysis dipstick  2. Gastroesophageal reflux disease without esophagitis  3. Hemoglobin A1c less than 7.0% Hgb A1c is stable at 7.2 today. We will continue to monitor.   4. Follow up He will follow up in 3 months.   No orders of the defined types were placed in this encounter.   Orders Placed This Encounter  Procedures  . POCT glucose (manual entry)  . POCT urinalysis dipstick    Referral Orders  No referral(s) requested today    Kathe Becton,  MSN, FNP-BC [place], [place] [address] [phone]- fax  Problem List Items Addressed This Visit      Digestive   Gastroesophageal reflux disease without esophagitis     Endocrine   Type 1 diabetes mellitus without complication ([place]) - Primary   Relevant Medications   insulin glargine (LANTUS) 100 UNIT/ML injection   Other Relevant Orders   POCT glucose (manual entry) (Completed)   POCT urinalysis dipstick (Completed)     Other   Hemoglobin A1c less than 7.0%    Other Visit Diagnoses    Follow up          No orders of the defined types were placed in this encounter.   Follow-up: Return in about 3 months (around 08/28/2019) for Labs/OV.    Azzie Glatter, FNP

## 2019-05-29 ENCOUNTER — Encounter: Payer: Self-pay | Admitting: Family Medicine

## 2019-07-03 MED FILL — ?BASAGLAR 100 UNITS/ML KWPE: 100 | 25 days supply | Qty: 9 | Fill #3

## 2019-07-03 MED FILL — TRUE METRIX TEST STRIP: 50 days supply | Qty: 200 | Fill #3

## 2019-07-03 MED FILL — ?HUMALOG 100 UNITS/ML VIAL: 100 | 22 days supply | Qty: 10 | Fill #3

## 2019-08-21 ENCOUNTER — Other Ambulatory Visit: Payer: Self-pay | Admitting: Family Medicine

## 2019-08-21 DIAGNOSIS — E109 Type 1 diabetes mellitus without complications: Secondary | ICD-10-CM

## 2019-08-21 MED FILL — ?HUMALOG 100 UNITS/ML VIAL: 100 | 22 days supply | Qty: 10 | Fill #0

## 2019-08-21 MED FILL — ?BASAGLAR 100 UNITS/ML KWPE: 100 | 16 days supply | Qty: 6 | Fill #4

## 2019-08-21 MED FILL — TRUE METRIX TEST STRIP: 50 days supply | Qty: 200 | Fill #4

## 2019-08-29 ENCOUNTER — Ambulatory Visit: Payer: Self-pay | Admitting: Nurse Practitioner

## 2019-11-07 ENCOUNTER — Other Ambulatory Visit: Payer: Self-pay | Admitting: Internal Medicine

## 2019-11-07 ENCOUNTER — Other Ambulatory Visit: Payer: Self-pay | Admitting: Family Medicine

## 2019-11-07 ENCOUNTER — Other Ambulatory Visit: Payer: Self-pay

## 2019-11-07 DIAGNOSIS — E109 Type 1 diabetes mellitus without complications: Secondary | ICD-10-CM

## 2019-11-07 MED ORDER — INSULIN LISPRO 100 UNIT/ML ~~LOC~~ SOLN: SUBCUTANEOUS | 0 refills | Status: DC

## 2019-11-07 MED ORDER — TRUE METRIX BLOOD GLUCOSE TEST VI STRP: ORAL_STRIP | 11 refills | Status: DC

## 2019-11-07 MED ORDER — INSULIN GLARGINE 100 UNIT/ML ~~LOC~~ SOLN
SUBCUTANEOUS | 0 refills | Status: DC
Start: 2019-11-07 — End: 2019-11-08

## 2019-11-08 ENCOUNTER — Encounter (HOSPITAL_COMMUNITY): Payer: Self-pay | Admitting: Emergency Medicine

## 2019-11-08 ENCOUNTER — Other Ambulatory Visit: Payer: Self-pay

## 2019-11-08 ENCOUNTER — Emergency Department (HOSPITAL_COMMUNITY)
Admission: EM | Admit: 2019-11-08 | Discharge: 2019-11-08 | Disposition: A | Discharge status: Home / Self Care | Payer: Self-pay | Attending: Emergency Medicine | Admitting: Emergency Medicine

## 2019-11-08 DIAGNOSIS — Z794 Long term (current) use of insulin: Secondary | ICD-10-CM | POA: Insufficient documentation

## 2019-11-08 DIAGNOSIS — Z760 Encounter for issue of repeat prescription: Secondary | ICD-10-CM | POA: Insufficient documentation

## 2019-11-08 DIAGNOSIS — E109 Type 1 diabetes mellitus without complications: Secondary | ICD-10-CM | POA: Insufficient documentation

## 2019-11-08 DIAGNOSIS — Z87891 Personal history of nicotine dependence: Secondary | ICD-10-CM | POA: Insufficient documentation

## 2019-11-08 LAB — CBG MONITORING, ED
Glucose-Capillary: 183 mg/dL — ABNORMAL HIGH (ref 70–99)
Glucose-Capillary: 108 mg/dL — ABNORMAL HIGH (ref 70–99)
Glucose-Capillary: 54 mg/dL — ABNORMAL LOW (ref 70–99)
Glucose-Capillary: 156 mg/dL — ABNORMAL HIGH (ref 70–99)

## 2019-11-08 MED ORDER — INSULIN GLARGINE 100 UNIT/ML ~~LOC~~ SOLN
25.0000 [IU] | Freq: Every day | SUBCUTANEOUS | Status: DC
  Filled 2019-11-08: qty 10

## 2019-11-08 MED ORDER — INSULIN GLARGINE 100 UNIT/ML ~~LOC~~ SOLN
SUBCUTANEOUS | 0 refills | Status: DC
Start: 2019-11-08 — End: 2019-11-19

## 2019-11-08 NOTE — ED Triage Notes (Signed)
Pt states he was unable to get insulin Rx filled at [facility] yesterday because he called late for the refill.  [facility]  [facility] thinking he had new insurance through employer and it was going to cost $1000.  He called [facility] to see if they had a generic that was cheaper and they told him to come to ED.  Pt states he is completely out of Lantus.

## 2019-11-08 NOTE — ED Notes (Signed)
Patient Alert and oriented to baseline. Stable and ambulatory to baseline. Patient verbalized understanding of the discharge instructions.  Patient belongings were taken by the patient.   

## 2019-11-08 NOTE — Discharge Instructions (Signed)
Please take your insulin as previously prescribed and please follow up with your PCP as well as your insurance to ensure you can continue to get your insulin.

## 2019-11-08 NOTE — TOC Initial Note (Signed)
Transition of Care [facility]) - Initial/Assessment Note    Patient Details  Name: Glenn Ball MRN: [id number] Date of Birth: 1990-05-27  Transition of Care [facility] CM/SW Contact:    Lockie Pares, RN Phone Number: 11/08/2019, 4:41 PM  Clinical Narrative:                  Patient is followed by [facility]  For PCP and Pharmacy. He called to [facility] and states he has had trouble  Getting in touch with [facility], I spoke to him about the call center responding quickly and making his health a priority. ON Friday he was too late getting his script. He apparently went somewhere and obtained a [facility]. 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 They told him to come to the ED. SO he presented here . In looking through his history, he has already had  MATCH< so he is ineligible for this program. I spoke to pharmasicist, who talked to main pharmacy. We are going to move forward with a zz program. Which will supply 3 days worth, as this is a threat to the patients well being, however < I stressed with the patient the importance of responsibility with his medical condition and that he needs to make this a priority, We can only offer 3 days worth of medication. I will also contact Robyne Peers, care manager at [facility], to notify her of these issues with the patient, patient verbalizing understanding of this., and is appreciati e of this assistance. . As of this note, the prescription is being processed.        Patient Goals and CMS Choice        Expected Discharge Plan and Services                                                Prior Living Arrangements/Services                       Activities of Daily Living      Permission Sought/Granted                  Emotional Assessment              Admission diagnosis:  needs insulin Patient Active  Problem List   Diagnosis Date Noted  . Hemoglobin A1c less than 7.0% 03/02/2019  . Gastroesophageal reflux disease without esophagitis 03/02/2019  . Type 1 diabetes mellitus without complication (HCC) 03/02/2017  . Dehydration 10/14/2011  . Dizziness 10/14/2011  . Weight loss 10/14/2011  . Diabetes mellitus, new onset (HCC) 10/13/2011  . Hyperglycemia 10/13/2011   PCP:  Mike Gip, FNP Pharmacy:   [facility] - [place], [place] - [address] [place] [address] Phone: [phone] Fax: [phone]  [id number] [place], [place] - [address] [place] [address] Phone: [phone] Fax: [phone]     Social Determinants of Health (SDOH) Interventions    Readmission Risk Interventions No flowsheet data found.

## 2019-11-08 NOTE — ED Provider Notes (Signed)
[facility] EMERGENCY DEPARTMENT Provider Note   CSN: [id number] Arrival date & time: 11/08/19  1020     History Chief Complaint  Patient presents with  . out of insulin    Glenn Ball is a 29 y.o. male.  The history is provided by the patient.  Medication Refill Medications/supplies requested:  Lantus Reason for request:  Prescriptions expired and medications ran out Medications taken before: yes - see home medications   Patient has complete original prescription information: yes        Past Medical History:  Diagnosis Date  . Depression   . Diabetes mellitus   . Type 1 diabetes [facility])     Patient Active Problem List   Diagnosis Date Noted  . Hemoglobin A1c less than 7.0% 03/02/2019  . Gastroesophageal reflux disease without esophagitis 03/02/2019  . Type 1 diabetes mellitus without complication ([place]) 08/67/6195  . Dehydration 10/14/2011  . Dizziness 10/14/2011  . Weight loss 10/14/2011  . Diabetes mellitus, new onset ([place]) 10/13/2011  . Hyperglycemia 10/13/2011    History reviewed. No pertinent surgical history.     Family History  Problem Relation Age of Onset  . Hypertension Mother   . Cancer Maternal Grandmother   . Hypertension Maternal Grandfather   . Diabetes Maternal Grandfather     Social History   Tobacco Use  . Smoking status: Former Research scientist (life sciences)  . Smokeless tobacco: Never Used  Vaping Use  . Vaping Use: Never used  Substance Use Topics  . Alcohol use: Not Currently    Comment: occ  . Drug use: Yes    Types: Marijuana    Comment: 1-2 times a month. occ    Home Medications Prior to Admission medications   Medication Sig Start Date End Date Taking? Authorizing Provider  Blood Glucose Monitoring Suppl (GLUCOCOM BLOOD GLUCOSE MONITOR) DEVI accu check machine with lancets and strips. 02/07/16   [provider]  Blood Glucose Monitoring Suppl (TRUE METRIX AIR GLUCOSE METER) w/Device KIT 1 each by Does not apply  route 4 (four) times daily -  with meals and at bedtime. 11/27/16   Dorena Dew, FNP  glucose blood (ONETOUCH VERIO) test strip by Other route Two (2) times a day. E11.9 02/09/16   [provider]  glucose blood (TRUE METRIX BLOOD GLUCOSE TEST) test strip Check blood sugars 3 times per day prior to meals and at bedtime. Use as instructed 11/07/19   Dorena Dew, FNP  Insulin Glargine (LANTUS SOLOSTAR) 100 UNIT/ML Solostar Pen Inject 35 Units into the skin at bedtime. 02/07/19   Tresa Garter, MD  insulin glargine (LANTUS) 100 UNIT/ML injection INJECT 25U EVERY EVENING. 11/08/19   Silvestre Gunner, MD  insulin lispro (HUMALOG) 100 UNIT/ML injection INJECT 0-0.15 MLS (0-15 UNITS TOTAL) INTO THE SKIN 3 (THREE) TIMES DAILY BEFORE MEALS. 11/07/19   Dorena Dew, FNP  Lancet Device MISC 1 each by Does not apply route 4 (four) times daily -  before meals and at bedtime. 03/02/17   Dorena Dew, FNP  omeprazole (PRILOSEC) 40 MG capsule TAKE 1 CAPSULE (40 MG TOTAL) BY MOUTH DAILY. 11/01/18   Lanae Boast, FNP  triamcinolone (KENALOG) 0.025 % ointment Apply 1 application topically 2 (two) times daily. 08/02/18   Lanae Boast, FNP  TRUEPLUS LANCETS 28G MISC 1 each by Does not apply route 4 (four) times daily. 03/19/18   Lanae Boast, FNP    Allergies    Sulfa antibiotics and Sulfasalazine  Review of Systems  Review of Systems  Constitutional: Negative for chills and fever.  HENT: Negative for ear pain and sore throat.   Eyes: Negative for pain and visual disturbance.  Respiratory: Negative for cough and shortness of breath.   Cardiovascular: Negative for chest pain and palpitations.  Gastrointestinal: Negative for abdominal pain and vomiting.  Genitourinary: Negative for dysuria and hematuria.  Musculoskeletal: Negative for arthralgias and back pain.  Skin: Negative for color change and rash.  Neurological: Negative for seizures and syncope.  All other systems  reviewed and are negative.   Physical Exam Updated Vital Signs BP 110/67 (BP Location: Right Arm)   Pulse 63   Temp 98.3 F (36.8 C) (Oral)   Resp 15   SpO2 100%   Physical Exam Vitals and nursing note reviewed.  Constitutional:      Appearance: He is well-developed.  HENT:     Head: Normocephalic and atraumatic.  Eyes:     Conjunctiva/sclera: Conjunctivae normal.  Cardiovascular:     Rate and Rhythm: Normal rate and regular rhythm.     Heart sounds: No murmur heard.   Pulmonary:     Effort: Pulmonary effort is normal. No respiratory distress.     Breath sounds: Normal breath sounds.  Abdominal:     Palpations: Abdomen is soft.     Tenderness: There is no abdominal tenderness.  Musculoskeletal:     Cervical back: Neck supple.  Skin:    General: Skin is warm and dry.  Neurological:     General: No focal deficit present.     Mental Status: He is alert and oriented to person, place, and time. Mental status is at baseline.  Psychiatric:        Mood and Affect: Mood normal.        Behavior: Behavior normal.     ED Results / Procedures / Treatments   Labs (all labs ordered are listed, but only abnormal results are displayed) Labs Reviewed  CBG MONITORING, ED - Abnormal; Notable for the following components:      Result Value   Glucose-Capillary 183 (*)    All other components within normal limits  CBG MONITORING, ED - Abnormal; Notable for the following components:   Glucose-Capillary 108 (*)    All other components within normal limits  CBG MONITORING, ED - Abnormal; Notable for the following components:   Glucose-Capillary 54 (*)    All other components within normal limits  CBG MONITORING, ED - Abnormal; Notable for the following components:   Glucose-Capillary 156 (*)    All other components within normal limits    EKG None  Radiology No results found.  Procedures Procedures (including critical care time)  Medications Ordered in ED Medications -  No data to display  ED Course  I have reviewed the triage vital signs and the nursing notes.  Pertinent labs & imaging results that were available during my care of the patient were reviewed by me and considered in my medical decision making (see chart for details).    MDM Rules/Calculators/A&P                          29 year old male with history of type 1 diabetes presents for medication refill.  Patient states that he took his Lantus last night however states that he is out of his supply of Lantus.  [facility] however learned that he was uninsured and learned that  his medication was approximately thousand dollars.  Patient is currently working as a temp and is unclear of his insurance status.  Patient was told by the pharmacist to come to the emergency department for his medication refill.  Currently is denying any symptoms including fever, chills, chest pain, shortness of breath no abdominal pain, nausea, vomiting.  Afebrile vital signs stable.  Exam as above.  No abnormalities on physical exam patient is currently denying any symptoms.  Patient had one episode of hypoglycemia of 54 while in the waiting room however all of his other readings have been above 100 and his most recent glucose was 156.  Do not feel that pt requires further labs at this time given he is A-sx and glucose has been normal to slightly high.  Low c/f hypoglycemia or DKA.  Consulted social work to discuss patient possible options for him to receive his medication.  SW spoke w/ pt and he will receive a 3d supply for his lantus however he needs to f/u w/ his PCP and his employer to figure out his insurance status and so he can have a reliable source of his insulin.  Discussed this plan w/ the pt who voiced agreement and understanding of the overall plan.  Pt discharged in stable condition w/o further events.    Final Clinical Impression(s) / ED Diagnoses Final diagnoses:    Medication refill    Rx / DC Orders ED Discharge Orders         Ordered    insulin glargine (LANTUS) 100 UNIT/ML injection        11/08/19 1555           Silvestre Gunner, MD 11/08/19 1603    Dorie Rank, MD 11/08/19 [phone]

## 2019-11-08 NOTE — ED Notes (Signed)
Pt given turkey sandwich and cranberry juice. 

## 2019-11-09 ENCOUNTER — Other Ambulatory Visit: Payer: Self-pay | Admitting: Family Medicine

## 2019-11-10 ENCOUNTER — Telehealth: Payer: Self-pay | Admitting: Nurse Practitioner

## 2019-11-10 NOTE — Telephone Encounter (Signed)
Error

## 2019-11-19 ENCOUNTER — Other Ambulatory Visit: Payer: Self-pay | Admitting: Nurse Practitioner

## 2019-11-19 ENCOUNTER — Ambulatory Visit (INDEPENDENT_AMBULATORY_CARE_PROVIDER_SITE_OTHER): Payer: Self-pay | Admitting: Nurse Practitioner

## 2019-11-19 ENCOUNTER — Encounter: Payer: Self-pay | Admitting: Nurse Practitioner

## 2019-11-19 ENCOUNTER — Other Ambulatory Visit: Payer: Self-pay

## 2019-11-19 VITALS — BP 129/70 | HR 92 | Temp 97.7°F | Ht 69.0 in | Wt 146.0 lb

## 2019-11-19 DIAGNOSIS — L209 Atopic dermatitis, unspecified: Secondary | ICD-10-CM

## 2019-11-19 DIAGNOSIS — E109 Type 1 diabetes mellitus without complications: Secondary | ICD-10-CM

## 2019-11-19 DIAGNOSIS — Z13220 Encounter for screening for lipoid disorders: Secondary | ICD-10-CM

## 2019-11-19 DIAGNOSIS — K219 Gastro-esophageal reflux disease without esophagitis: Secondary | ICD-10-CM

## 2019-11-19 LAB — GLUCOSE, POCT (MANUAL RESULT ENTRY): POC Glucose: 116 mg/dl — AB (ref 70–99)

## 2019-11-19 LAB — POCT GLYCOSYLATED HEMOGLOBIN (HGB A1C)
HbA1c POC (<> result, manual entry): 7.7 % (ref 4.0–5.6)
HbA1c, POC (controlled diabetic range): 7.7 % — AB (ref 0.0–7.0)
HbA1c, POC (prediabetic range): 7.7 % — AB (ref 5.7–6.4)
Hemoglobin A1C: 7.7 % — AB (ref 4.0–5.6)

## 2019-11-19 MED ORDER — INSULIN GLARGINE 100 UNIT/ML ~~LOC~~ SOLN: SUBCUTANEOUS | 11 refills | Status: DC

## 2019-11-19 MED ORDER — TRIAMCINOLONE ACETONIDE 0.025 % EX OINT: 1.0000 "application " | TOPICAL_OINTMENT | Freq: Two times a day (BID) | CUTANEOUS | 0 refills | Status: AC

## 2019-11-19 MED ORDER — TRUE METRIX BLOOD GLUCOSE TEST VI STRP
ORAL_STRIP | 11 refills | Status: AC
  Filled 2020-07-09: qty 200, 50d supply, fill #0
  Filled 2020-09-24: qty 200, 50d supply, fill #1

## 2019-11-19 MED ORDER — INSULIN LISPRO 100 UNIT/ML ~~LOC~~ SOLN: SUBCUTANEOUS | 11 refills | Status: DC

## 2019-11-19 MED FILL — TRUE METRIX TEST STRIP: 33 days supply | Qty: 100 | Fill #0

## 2019-11-19 MED FILL — INSULIN LISPRO 100 UNIT/ML: 100 | 22 days supply | Qty: 10 | Fill #0

## 2019-11-19 MED FILL — TRIAMCINOLONE 0.025% OINT: 0.025 | 14 days supply | Qty: 60 | Fill #0

## 2019-11-19 NOTE — Progress Notes (Addendum)
[name], [place]  [address] Phone:  [phone]   Fax:  [phone]   Established Patient Office Visit  Subjective:  Patient ID: Glenn Ball, male    DOB: 04/03/1990  Age: 29 y.o. MRN: [id number]  CC:  Chief Complaint  Patient presents with  . Follow-up    diabetes    HPI Glenn Ball presents for follow up. He  has a past medical history of Depression, Diabetes mellitus, and Type 1 diabetes ([place]).   Diabetes Mellitus Type I, Initial Patient here for evaluation of Type 1 diabetes mellitus.  The initial diagnosis of diabetes was made in 2014. However ED at that time indicated Type 2 DM.  The patient's diagnosis of diabetes mellitus was made based on the following criteria: RCBG and A1C  His clinical course has been stable. Insulin dosage review with Endy suggested compliance most of the time. Associated symptoms of hyperglycemia have been none.  Associated symptoms of hypoglycemia have been jitteriness.  Range 50-420. He did not really eat anything peanut butter sandwich and chips. He admits that this is   He is currently taking Humalog 0-15 units and Lantus 25 units units pre-breakfast, Humalog 1-15 units units pre-lunch, Humalog 1-15 units units pre-dinner, and Humalog 1-15 units units at bedtime.  Insulin injections are given by patient.   Compliance with blood glucose monitoring: good.  The patient does perform independently. Rotation of sites for injection: abdominal wall Exercise: works fulltime  Meal panning: He is using no plan.       MedicAlert Identification Noted? no   Denies headache, dizziness, visual changes, shortness of breath, dyspnea on exertion, chest pain, nausea, vomiting or any edema.    Past Medical History:  Diagnosis Date  . Depression   . Diabetes mellitus   . Type 1 diabetes ([place])     History reviewed. No pertinent surgical history.  Family History  Problem Relation Age of Onset  .  Hypertension Mother   . Cancer Maternal Grandmother   . Hypertension Maternal Grandfather   . Diabetes Maternal Grandfather     Social History   Socioeconomic History  . Marital status: Single    Spouse name: Not on file  . Number of children: Not on file  . Years of education: Not on file  . Highest education level: Not on file  Occupational History  . Not on file  Tobacco Use  . Smoking status: Former Research scientist (life sciences)  . Smokeless tobacco: Never Used  Vaping Use  . Vaping Use: Never used  Substance and Sexual Activity  . Alcohol use: Not Currently    Comment: occ  . Drug use: Yes    Types: Marijuana    Comment: 1-2 times a month. occ  . Sexual activity: Yes  Other Topics Concern  . Not on file  Social History Narrative  . Not on file   Social Determinants of Health   Financial Resource Strain:   . Difficulty of Paying Living Expenses: Not on file  Food Insecurity:   . Worried About Charity fundraiser in the Last Year: Not on file  . Ran Out of Food in the Last Year: Not on file  Transportation Needs:   . Lack of Transportation (Medical): Not on file  . Lack of Transportation (Non-Medical): Not on file  Physical Activity:   . Days of Exercise per Week: Not on file  . Minutes of Exercise per Session: Not on file  Stress:   .  Feeling of Stress : Not on file  Social Connections:   . Frequency of Communication with Friends and Family: Not on file  . Frequency of Social Gatherings with Friends and Family: Not on file  . Attends Religious Services: Not on file  . Active Member of Clubs or Organizations: Not on file  . Attends Archivist Meetings: Not on file  . Marital Status: Not on file  Intimate Partner Violence:   . Fear of Current or Ex-Partner: Not on file  . Emotionally Abused: Not on file  . Physically Abused: Not on file  . Sexually Abused: Not on file    Outpatient Medications Prior to Visit  Medication Sig Dispense Refill  . Blood Glucose  Monitoring Suppl (GLUCOCOM BLOOD GLUCOSE MONITOR) DEVI accu check machine with lancets and strips.    . Blood Glucose Monitoring Suppl (TRUE METRIX AIR GLUCOSE METER) w/Device KIT 1 each by Does not apply route 4 (four) times daily -  with meals and at bedtime. 1 kit 0  . Lancet Device MISC 1 each by Does not apply route 4 (four) times daily -  before meals and at bedtime. 100 each 11  . omeprazole (PRILOSEC) 40 MG capsule TAKE 1 CAPSULE (40 MG TOTAL) BY MOUTH DAILY. 30 capsule 2  . TRUEPLUS LANCETS 28G MISC 1 each by Does not apply route 4 (four) times daily. 100 each 11  . glucose blood (ONETOUCH VERIO) test strip by Other route Two (2) times a day. E11.9    . glucose blood (TRUE METRIX BLOOD GLUCOSE TEST) test strip Check blood sugars 3 times per day prior to meals and at bedtime. Use as instructed 200 each 11  . insulin glargine (LANTUS) 100 UNIT/ML injection INJECT 25U EVERY EVENING. 10 mL 0  . insulin lispro (HUMALOG) 100 UNIT/ML injection INJECT 0-0.15 MLS (0-15 UNITS TOTAL) INTO THE SKIN 3 (THREE) TIMES DAILY BEFORE MEALS. 10 mL 0  . triamcinolone (KENALOG) 0.025 % ointment Apply 1 application topically 2 (two) times daily. 60 g 0   No facility-administered medications prior to visit.    Allergies  Allergen Reactions  . Sulfa Antibiotics     "just knows he's allergic"  . Sulfasalazine     "just knows he's allergic"    ROS Review of Systems  All other systems reviewed and are negative.     Objective:    Physical Exam Vitals reviewed.  Constitutional:      General: He is not in acute distress.    Appearance: He is normal weight. He is not ill-appearing, toxic-appearing or diaphoretic.  HENT:     Head: Normocephalic and atraumatic.     Nose: Nose normal. No congestion.     Mouth/Throat:     Mouth: Mucous membranes are moist.  Cardiovascular:     Rate and Rhythm: Normal rate and regular rhythm.     Pulses: Normal pulses.     Heart sounds: Normal heart sounds.    Pulmonary:     Effort: Pulmonary effort is normal.     Breath sounds: Normal breath sounds.  Abdominal:     General: Bowel sounds are normal.     Palpations: Abdomen is soft.  Musculoskeletal:        General: Normal range of motion.     Cervical back: Normal range of motion.  Skin:    General: Skin is warm and dry.  Neurological:     General: No focal deficit present.     Mental Status: He  is alert and oriented to person, place, and time.  Psychiatric:        Mood and Affect: Mood normal.        Behavior: Behavior normal.        Thought Content: Thought content normal.        Judgment: Judgment normal.     Comments: Somewhat attentive     BP 129/70   Pulse 92   Temp 97.7 F (36.5 C) (Temporal)   Ht '5\' 9"'  (1.753 m)   Wt 146 lb (66.2 kg)   SpO2 100%   BMI 21.56 kg/m  Wt Readings from Last 3 Encounters:  11/19/19 146 lb (66.2 kg)  05/28/19 150 lb (68 kg)  02/28/19 149 lb 12.8 oz (67.9 kg)     Health Maintenance Due  Topic Date Due  . OPHTHALMOLOGY EXAM  Never done  . URINE MICROALBUMIN  12/29/2018    There are no preventive care reminders to display for this patient.  Lab Results  Component Value Date   TSH 0.894 07/25/2018   Lab Results  Component Value Date   WBC 5.3 10/26/2018   HGB 15.9 10/26/2018   HCT 48.5 10/26/2018   MCV 84.3 10/26/2018   PLT 194 10/26/2018   Lab Results  Component Value Date   NA 135 10/26/2018   K 4.4 10/26/2018   CO2 25 10/26/2018   GLUCOSE 205 (H) 10/26/2018   BUN 15 10/26/2018   CREATININE 1.10 10/26/2018   BILITOT 2.9 (H) 10/26/2018   ALKPHOS 62 10/26/2018   AST 18 10/26/2018   ALT 11 10/26/2018   PROT 7.0 10/26/2018   ALBUMIN 4.2 10/26/2018   CALCIUM 9.2 10/26/2018   ANIONGAP 10 10/26/2018   No results found for: CHOL No results found for: HDL No results found for: LDLCALC No results found for: TRIG No results found for: CHOLHDL Lab Results  Component Value Date   HGBA1C 7.7 (A) 11/19/2019   HGBA1C 7.7  11/19/2019   HGBA1C 7.7 (A) 11/19/2019   HGBA1C 7.7 (A) 11/19/2019      Assessment & Plan:   Problem List Items Addressed This Visit      Digestive   Gastroesophageal reflux disease without esophagitis - Primary     Endocrine   Type 1 diabetes mellitus without complication ([place]) Encourage compliance with current treatment regimen   Encourage regular CBG monitoring. Encouraged continued glucose monitoring patient declined at this time.  Encourage contacting office if excessive hyperglycemia and or hypoglycemia Lifestyle modification with healthy diet (fewer calories, more high fiber foods, whole grains and non-starchy vegetables, lower fat meat and fish, low-fat diary include healthy oils) regular exercise (physical activity)  Opthalmology exam discussed  Nutritional consult recommended Regular dental visits encouraged Home BP monitoring also encouraged goal <130/80     Relevant Medications   insulin lispro (HUMALOG) 100 UNIT/ML injection   glucose blood (TRUE METRIX BLOOD GLUCOSE TEST) test strip   insulin glargine (LANTUS) 100 UNIT/ML injection   Other Relevant Orders   Lipid panel   Comp. Metabolic Panel (12)   Microalbumin, urine   Glucose (CBG) (Completed)   POCT HgB A1C (Completed)    Other Visit Diagnoses    Atopic dermatitis, unspecified type       Relevant Medications   triamcinolone (KENALOG) 0.025 % ointment   Screening for cholesterol level          Meds ordered this encounter  Medications  . DISCONTD: insulin glargine (LANTUS) 100 UNIT/ML injection    Sig:  INJECT 25U EVERY EVENING.    Dispense:  10 mL    Refill:  11    Order Specific Question:   Supervising Provider    Answer:   Tresa Garter [id number]  . insulin lispro (HUMALOG) 100 UNIT/ML injection    Sig: INJECT 0-0.15 MLS (0-15 UNITS TOTAL) INTO THE SKIN 3 (THREE) TIMES DAILY BEFORE MEALS.    Dispense:  10 mL    Refill:  11    Patient   must come to next appointment for more refills on  this medication .    Order Specific Question:   Supervising Provider    Answer:   Tresa Garter [id number]  . glucose blood (TRUE METRIX BLOOD GLUCOSE TEST) test strip    Sig: Check blood sugars 3 times per day prior to meals and at bedtime. Use as instructed    Dispense:  200 each    Refill:  11    Order Specific Question:   Supervising Provider    Answer:   Tresa Garter [id number]  . triamcinolone (KENALOG) 0.025 % ointment    Sig: Apply 1 application topically 2 (two) times daily.    Dispense:  60 g    Refill:  0    Order Specific Question:   Supervising Provider    Answer:   Tresa Garter [id number]  . insulin glargine (LANTUS) 100 UNIT/ML injection    Sig: INJECT 25U EVERY EVENING.    Dispense:  10 mL    Refill:  11    Order Specific Question:   Supervising Provider    Answer:   Tresa Garter [[id number]]    Follow-up: Return in about 3 months (around 02/18/2020).    Vevelyn Francois, NP

## 2019-11-19 NOTE — Patient Instructions (Signed)
Type 1 Diabetes Mellitus, Self Care, Adult When you have type 1 diabetes (type 1 diabetes mellitus), you must make sure your blood sugar (glucose) stays in a healthy range. You can do this with:  Insulin.  Nutrition.  Exercise.  Lifestyle changes.  Other medicines, if needed.  Support from your doctors and others. How to stay aware of blood sugar   Check your blood sugar every day, as often as told.  Have your A1c (hemoglobin A1c) level checked two or more times a year. Have it checked more often if your doctor tells you to. Your doctor will set personal treatment goals for you. Generally, you should have these blood sugar levels:  Before meals (preprandial): 80-130 mg/dL (4.4-7.2 mmol/L).  After meals (postprandial): below 180 mg/dL (10 mmol/L).  A1c level: less than 7%. How to manage high and low blood sugar Signs of high blood sugar High blood sugar is called hyperglycemia. Know the signs of high blood sugar. Signs may include:  Feeling: ? Thirsty. ? Hungry. ? Very tired.  Needing to pee (urinate) more than usual.  Blurry vision. Signs of low blood sugar Low blood sugar is called hypoglycemia. This is when blood sugar is at or below 70 mg/dL (3.9 mmol/L). Signs may include:  Feeling: ? Hungry. ? Worried or nervous (anxious). ? Sweaty and clammy. ? Confused. ? Dizzy. ? Sleepy. ? Sick to your stomach (nauseous).  Having: ? A fast heartbeat. ? A headache. ? A change in your vision. ? Tingling or no feeling (numbness) around your mouth, lips, or tongue. ? Jerky movements that you cannot control (seizure).  Having trouble with: ? Moving (coordination). ? Sleeping. ? Passing out (fainting). ? Getting upset easily (irritability). Treating low blood sugar To treat low blood sugar, eat or drink something sugary right away. If you can think clearly and swallow safely, follow the 15:15 rule:  Take 15 grams of a fast-acting carb (carbohydrate). Talk with your  doctor about how much you should take.  Some fast-acting carbs are: ? Sugar tablets (glucose pills). Take 3-4 pills. ? 6-8 pieces of hard candy. ? 4-6 oz (120-50 mL) of fruit juice. ? 4-6 oz (120-150 mL) of regular (not diet) soda. ? Honey or sugar (1 Tbsp).  Check your blood sugar 15 minutes after you take the carb.  If your blood sugar is still at or below 70 mg/dL (3.9 mmol/L), take 15 grams of a carb again.  If your blood sugar does not go above 70 mg/dL (3.9 mmol/L) after 3 tries, get help right away.  After your blood sugar goes back to normal, eat a meal or a snack within 1 hour. Treating very low blood sugar If your blood sugar is at or below 54 mg/dL (3 mmol/L), you have very low blood sugar (severe hypoglycemia). This is an emergency. Do not wait to see if the symptoms will go away. Get medical help right away. Call your local emergency services (911 in the [place] If you have very low blood sugar and you cannot eat or drink, you may need a glucagon shot (injection). A family member or friend should learn how to check your blood sugar and how to give you a glucagon shot. Ask your doctor if you need to have a glucagon shot kit at home. Follow these instructions at home: Medicine  Take insulin and diabetes medicines as told.  If your doctor says you should take more or less insulin and medicines, do this exactly as told.  Do   not run out of insulin or medicines. Having diabetes can put you at risk for other long-term conditions. These include heart disease and kidney disease. Your doctor may prescribe medicines to help you not have these problems. Food   Make healthy food choices. These include: ? Chicken, fish, egg whites, and beans. ? Oats, whole wheat, bulgur, brown rice, quinoa, and millet. ? Fresh fruits and vegetables. ? Low-fat dairy products. ? Nuts, avocado, olive oil, and canola oil.  Meet with a food specialist (registered dietitian). He or she can help you make  an eating plan that is right for you.  Follow instructions from your doctor about what you cannot eat or drink.  Drink enough fluid to keep your pee (urine) pale yellow.  Keep track of carbs that you eat. Do this by reading food labels and learning food serving sizes.  Follow your sick day plan when you cannot eat or drink normally. Make this plan with your doctor so it is ready to use. Activity  Exercise 3 or more times a week.  Do not go more than 2 days without exercising.  Talk with your doctor before you start a new exercise. Your doctor may need to tell you to change: ? How much insulin or medicines you take. ? How much food you eat. Lifestyle  Do not use any tobacco products. These include cigarettes, chewing tobacco, and e-cigarettes. If you need help quitting, ask your doctor.  Ask your doctor how much alcohol is safe for you.  Learn to deal with stress. If you need help with this, ask your doctor. Body care   Stay up to date with your shots (immunizations).  Have your eyes and feet checked by a doctor as often as told.  Check your skin and feet every day. Check for cuts, bruises, redness, blisters, or sores.  Brush your teeth and gums two times a day. Floss one or more times a day.  Go to the dentist one or more times every 6 months.  Stay at a healthy weight. General instructions  Take over-the-counter and prescription medicines only as told by your doctor.  Share your diabetes care plan with: ? Your work or school. ? People you live with.  Check your pee (urine) for ketones: ? When you are sick. ? As told by your doctor.  Carry a card or wear jewelry that says you have diabetes.  Keep all follow-up visits as told by your doctor. This is important. Questions to ask your doctor  Do I need to meet with a diabetes educator?  Where can I find a support group for people with diabetes? Where to find more information To learn more about diabetes,  visit:  American Diabetes Association: www.diabetes.org  American Association of Diabetes Educators: www.diabeteseducator.org Summary  When you have type 1 diabetes, you must make sure your blood sugar (glucose) stays in a healthy range.  Check your blood sugar every day, as often as told.  Take insulin and diabetes medicines as told.  Keep all follow-up visits as told by your doctor. This is important. This information is not intended to replace advice given to you by your health care provider. Make sure you discuss any questions you have with your health care provider. Document Revised: 09/27/2017 Document Reviewed: 04/09/2015 Elsevier Patient Education  2020 [employer]   

## 2019-11-20 LAB — COMP. METABOLIC PANEL (12)
AST: 17 IU/L (ref 0–40)
Albumin/Globulin Ratio: 1.8 (ref 1.2–2.2)
Albumin: 4.4 g/dL (ref 4.1–5.2)
Alkaline Phosphatase: 81 IU/L (ref 48–121)
BUN/Creatinine Ratio: 10 (ref 9–20)
BUN: 11 mg/dL (ref 6–20)
Bilirubin Total: 3 mg/dL — ABNORMAL HIGH (ref 0.0–1.2)
Calcium: 9.6 mg/dL (ref 8.7–10.2)
Chloride: 101 mmol/L (ref 96–106)
Creatinine, Ser: 1.15 mg/dL (ref 0.76–1.27)
GFR calc Af Amer: 99 mL/min/{1.73_m2} (ref >59)
GFR calc non Af Amer: 86 mL/min/{1.73_m2} (ref >59)
Globulin, Total: 2.4 g/dL (ref 1.5–4.5)
Glucose: 99 mg/dL (ref 65–99)
Potassium: 3.9 mmol/L (ref 3.5–5.2)
Sodium: 141 mmol/L (ref 134–144)
Total Protein: 6.8 g/dL (ref 6.0–8.5)

## 2019-11-20 LAB — LIPID PANEL
Chol/HDL Ratio: 3.1 ratio (ref 0.0–5.0)
Cholesterol, Total: 142 mg/dL (ref 100–199)
HDL: 46 mg/dL (ref >39)
LDL Chol Calc (NIH): 84 mg/dL (ref 0–99)
Triglycerides: 56 mg/dL (ref 0–149)
VLDL Cholesterol Cal: 12 mg/dL (ref 5–40)

## 2019-11-20 LAB — MICROALBUMIN, URINE: Microalbumin, Urine: 25.4 ug/mL

## 2019-12-24 ENCOUNTER — Other Ambulatory Visit: Payer: Self-pay | Admitting: Nurse Practitioner

## 2019-12-24 ENCOUNTER — Telehealth: Payer: Self-pay

## 2019-12-24 DIAGNOSIS — E109 Type 1 diabetes mellitus without complications: Secondary | ICD-10-CM

## 2019-12-24 MED ORDER — LANTUS SOLOSTAR 100 UNIT/ML ~~LOC~~ SOPN
25.0000 [IU] | PEN_INJECTOR | Freq: Every day | SUBCUTANEOUS | 11 refills | Status: DC
  Filled 2020-07-09: qty 9, 36d supply, fill #0

## 2019-12-24 MED FILL — ?BASAGLAR 100 UNITS/ML KWPE: 100 | 24 days supply | Qty: 6 | Fill #0

## 2019-12-24 MED FILL — ?HUMALOG 100 UNITS/ML VIAL: 100 | 22 days supply | Qty: 10 | Fill #1

## 2019-12-24 NOTE — Telephone Encounter (Signed)
Received a call from [facility] requesting that the prescription for changed from Humalog bottle insulin to a Pin

## 2019-12-25 ENCOUNTER — Other Ambulatory Visit: Payer: Self-pay | Admitting: Nurse Practitioner

## 2019-12-25 DIAGNOSIS — E109 Type 1 diabetes mellitus without complications: Secondary | ICD-10-CM

## 2019-12-25 MED ORDER — INSULIN LISPRO 200 UNIT/ML ~~LOC~~ SOPN
0.0000 [IU] | PEN_INJECTOR | Freq: Three times a day (TID) | SUBCUTANEOUS | 6 refills | Status: AC
Start: 2019-12-25 — End: 2020-12-24
  Filled 2020-08-24: qty 3, 13d supply, fill #0
  Filled 2020-09-06 – 2020-09-23 (×2): qty 6, 26d supply, fill #1
  Filled 2020-10-14: qty 6, 26d supply, fill #2

## 2019-12-25 NOTE — Telephone Encounter (Signed)
Sent!

## 2020-01-22 MED FILL — $LANTUS SOLOSTAR 100 UNITS/: 100 | 24 days supply | Qty: 6 | Fill #1

## 2020-02-18 ENCOUNTER — Ambulatory Visit: Payer: Self-pay | Admitting: Nurse Practitioner

## 2020-02-24 MED FILL — $LANTUS SOLOSTAR 100 UNITS/: 100 | 36 days supply | Qty: 9 | Fill #2

## 2020-02-24 MED FILL — ?HUMALOG 100 UNITS/ML VIAL: 100 | 22 days supply | Qty: 10 | Fill #2

## 2020-03-01 ENCOUNTER — Encounter: Payer: Self-pay | Admitting: Nurse Practitioner

## 2020-03-01 ENCOUNTER — Ambulatory Visit (INDEPENDENT_AMBULATORY_CARE_PROVIDER_SITE_OTHER): Payer: Self-pay | Admitting: Nurse Practitioner

## 2020-03-01 ENCOUNTER — Other Ambulatory Visit: Payer: Self-pay

## 2020-03-01 VITALS — BP 117/75 | HR 71 | Temp 97.9°F | Resp 18 | Ht 70.0 in | Wt 150.0 lb

## 2020-03-01 DIAGNOSIS — E109 Type 1 diabetes mellitus without complications: Secondary | ICD-10-CM

## 2020-03-01 DIAGNOSIS — K219 Gastro-esophageal reflux disease without esophagitis: Secondary | ICD-10-CM

## 2020-03-01 LAB — POCT URINALYSIS DIP (CLINITEK)
Bilirubin, UA: NEGATIVE
Blood, UA: NEGATIVE
Glucose, UA: NEGATIVE mg/dL
Ketones, POC UA: NEGATIVE mg/dL
Leukocytes, UA: NEGATIVE
Nitrite, UA: NEGATIVE
Spec Grav, UA: >=1.03 — AB (ref 1.010–1.025)
Urobilinogen, UA: 0.2 E.U./dL
pH, UA: 6.5 (ref 5.0–8.0)

## 2020-03-01 LAB — POCT GLYCOSYLATED HEMOGLOBIN (HGB A1C): Hemoglobin A1C: 7.5 % — AB (ref 4.0–5.6)

## 2020-03-01 LAB — POCT CBG (FASTING - GLUCOSE)-MANUAL ENTRY: Glucose Fasting, POC: 189 mg/dL — AB (ref 70–99)

## 2020-03-01 MED ORDER — FREESTYLE LIBRE 14 DAY SENSOR MISC: 1.0000 | Freq: Every day | 11 refills | Status: AC

## 2020-03-01 MED ORDER — FREESTYLE LIBRE 14 DAY READER DEVI: 1.0000 | Freq: Every day | 0 refills | Status: AC

## 2020-03-01 NOTE — Progress Notes (Signed)
[place], [place]  [address] Phone:  [phone]   Fax:  [phone]   Established Patient Office Visit  Subjective:  Patient ID: Glenn Ball, male    DOB: [birth year]  Age: 29 y.o. MRN: [id number]  CC:  Chief Complaint  Patient presents with   Follow-up    HPI Glenn Ball presents for follow up. He  has a past medical history of Depression, Diabetes mellitus, and Type 1 diabetes ([place]).   Diabetes Mellitus Patient presents for follow up of diabetes. Current symptoms include: hyperglycemia and hypoglycemia . He had an epsiode when it was 588. He admits that has had some spikes. He admits that one morning was 300. He has had some moments when he was out of his Lantus. He has has 32.  He amdits that he had eaten however had maybe used too much insulin. Symptoms have stabilized. Patient denies foot ulcerations, increased appetite, nausea, polydipsia, polyuria, vomiting and weight loss. Evaluation to date has included: fasting blood sugar, fasting lipid panel, hemoglobin A1C and microalbuminuria.  Home sugars: were generally in range however there were a few sikes to >500. This could have been related to the diet however one instance was not related, . Current treatment: Continued insulin which has been somewhat effective. Last dilated eye exam: TBS. He does have some problems with continuing to monitor his CBG continuously.   Past Medical History:  Diagnosis Date   Depression    Diabetes mellitus    Type 1 diabetes ([place])     No past surgical history on file.  Family History  Problem Relation Age of Onset   Hypertension Mother    Cancer Maternal Grandmother    Hypertension Maternal Grandfather    Diabetes Maternal Grandfather     Social History   Socioeconomic History   Marital status: Single    Spouse name: Not on file   Number of children: Not on file   Years of education: Not on file   Highest education level: Not  on file  Occupational History   Not on file  Tobacco Use   Smoking status: Former Smoker   Smokeless tobacco: Never Used  Scientific laboratory technician Use: Never used  Substance and Sexual Activity   Alcohol use: Not Currently    Comment: occ   Drug use: Yes    Types: Marijuana    Comment: 1-2 times a month. occ   Sexual activity: Yes  Other Topics Concern   Not on file  Social History Narrative   Not on file   Social Determinants of Health   Financial Resource Strain: Not on file  Food Insecurity: Not on file  Transportation Needs: Not on file  Physical Activity: Not on file  Stress: Not on file  Social Connections: Not on file  Intimate Partner Violence: Not on file    Outpatient Medications Prior to Visit  Medication Sig Dispense Refill   Blood Glucose Monitoring Suppl (GLUCOCOM BLOOD GLUCOSE MONITOR) DEVI accu check machine with lancets and strips.     Blood Glucose Monitoring Suppl (TRUE METRIX AIR GLUCOSE METER) w/Device KIT 1 each by Does not apply route 4 (four) times daily -  with meals and at bedtime. 1 kit 0   glucose blood (TRUE METRIX BLOOD GLUCOSE TEST) test strip Check blood sugars 3 times per day prior to meals and at bedtime. Use as instructed 200 each 11   insulin glargine (LANTUS) 100 UNIT/ML Solostar  Pen Inject 25 Units into the skin daily. 15 mL 11   insulin lispro (HUMALOG) 200 UNIT/ML KwikPen Inject 0-15 Units into the skin 3 (three) times daily before meals. 3 mL 11   Lancet Device MISC 1 each by Does not apply route 4 (four) times daily -  before meals and at bedtime. 100 each 11   omeprazole (PRILOSEC) 40 MG capsule TAKE 1 CAPSULE (40 MG TOTAL) BY MOUTH DAILY. 30 capsule 2   triamcinolone (KENALOG) 0.025 % ointment Apply 1 application topically 2 (two) times daily. 60 g 0   TRUEPLUS LANCETS 28G MISC 1 each by Does not apply route 4 (four) times daily. 100 each 11   No facility-administered medications prior to visit.    Allergies   Allergen Reactions   Sulfa Antibiotics     "just knows he's allergic"   Sulfasalazine     "just knows he's allergic"    ROS Review of Systems    Objective:    Physical Exam Constitutional:      General: He is not in acute distress.    Appearance: He is not ill-appearing, toxic-appearing or diaphoretic.  HENT:     Head: Normocephalic and atraumatic.     Nose: Nose normal.     Mouth/Throat:     Mouth: Mucous membranes are moist.  Cardiovascular:     Rate and Rhythm: Normal rate and regular rhythm.     Pulses: Normal pulses.     Heart sounds: Normal heart sounds.  Pulmonary:     Effort: Pulmonary effort is normal.     Breath sounds: Normal breath sounds.  Abdominal:     General: Bowel sounds are normal.     Palpations: Abdomen is soft.  Musculoskeletal:        General: Normal range of motion.     Cervical back: Normal range of motion.  Skin:    General: Skin is warm and dry.     Capillary Refill: Capillary refill takes less than 2 seconds.  Neurological:     General: No focal deficit present.     Mental Status: He is alert.  Psychiatric:        Mood and Affect: Mood normal.        Behavior: Behavior normal.        Thought Content: Thought content normal.        Judgment: Judgment normal.     BP 117/75 (BP Location: Right Arm, Patient Position: Sitting, Cuff Size: Normal)    Pulse 71    Temp 97.9 F (36.6 C) (Temporal)    Resp 18    Ht '5\' 10"'  (1.778 m)    Wt 150 lb (68 kg)    SpO2 100%    BMI 21.52 kg/m  Wt Readings from Last 3 Encounters:  03/01/20 150 lb (68 kg)  11/19/19 146 lb (66.2 kg)  05/28/19 150 lb (68 kg)     Health Maintenance Due  Topic Date Due   OPHTHALMOLOGY EXAM  Never done    There are no preventive care reminders to display for this patient.  Lab Results  Component Value Date   TSH 0.894 07/25/2018   Lab Results  Component Value Date   WBC 5.3 10/26/2018   HGB 15.9 10/26/2018   HCT 48.5 10/26/2018   MCV 84.3 10/26/2018    PLT 194 10/26/2018   Lab Results  Component Value Date   NA 141 11/19/2019   K 3.9 11/19/2019   CO2 25 10/26/2018  GLUCOSE 99 11/19/2019   BUN 11 11/19/2019   CREATININE 1.15 11/19/2019   BILITOT 3.0 (H) 11/19/2019   ALKPHOS 81 11/19/2019   AST 17 11/19/2019   ALT 11 10/26/2018   PROT 6.8 11/19/2019   ALBUMIN 4.4 11/19/2019   CALCIUM 9.6 11/19/2019   ANIONGAP 10 10/26/2018   Lab Results  Component Value Date   CHOL 142 11/19/2019   Lab Results  Component Value Date   HDL 46 11/19/2019   Lab Results  Component Value Date   LDLCALC 84 11/19/2019   Lab Results  Component Value Date   TRIG 56 11/19/2019   Lab Results  Component Value Date   CHOLHDL 3.1 11/19/2019   Lab Results  Component Value Date   HGBA1C 7.5 (A) 03/01/2020      Assessment & Plan:   Problem List Items Addressed This Visit      Digestive   Gastroesophageal reflux disease without esophagitis     Endocrine   Type 1 diabetes mellitus without complication (HCC) - Primary Encourage compliance with current treatment regimen  Encourage regular CBG monitoring would like to trial freestyle [employer] contacting office if excessive hyperglycemia and or hypoglycemia Lifestyle modification with healthy diet (fewer calories, more high fiber foods, whole grains and non-starchy vegetables, lower fat meat and fish, low-fat diary include healthy oils) regular exercise (physical activity) Opthalmology exam discussed  Nutritional consult recommended Regular dental visits encouraged Home BP monitoring also encouraged goal <130/80     Relevant Orders   Glucose (CBG), Fasting (Completed)   HgB A1c (Completed)   POCT URINALYSIS DIP (CLINITEK) (Completed)      Meds ordered this encounter  Medications   Continuous Blood Gluc Sensor (FREESTYLE LIBRE 14 DAY SENSOR) MISC    Sig: 1 applicator by Does not apply route daily at 2 PM.    Dispense:  2 each    Refill:  11    Order Specific Question:    Supervising Provider    Answer:   Tresa Garter [[id number]]   Continuous Blood Gluc Receiver (FREESTYLE LIBRE 14 DAY READER) DEVI    Sig: 1 applicator by Does not apply route daily at 2 PM.    Dispense:  1 each    Refill:  0    Order Specific Question:   Supervising Provider    Answer:   Tresa Garter [id number]    Follow-up: Return in about 3 months (around 05/30/2020) for nurse visit for freestyle for Thursday.    Vevelyn Francois, NP

## 2020-03-01 NOTE — Progress Notes (Signed)
Hurt at work on [employer], L ankle injured

## 2020-03-01 NOTE — Patient Instructions (Signed)
Preventing Diabetes Mellitus Complications You can take action to prevent or slow down problems that are caused by diabetes (diabetes mellitus). Following your diabetes plan and taking care of yourself can reduce your risk of serious or life-threatening complications. What actions can I take to prevent diabetes complications? Manage your diabetes   Follow instructions from your health care providers about managing your diabetes. Your diabetes may be managed by a team of health care providers who can teach you how to care for yourself and can answer questions that you have.  Educate yourself about your condition so you can make healthy choices about eating and physical activity.  Check your blood sugar (glucose) levels as often as directed. Your health care provider will help you decide how often to check your blood glucose level depending on your treatment goals and how well you are meeting them.  Ask your health care provider if you should take low-dose aspirin daily and what dose is recommended for you. Taking low-dose aspirin daily is recommended to help prevent cardiovascular disease. Do not use nicotine or tobacco Do not use any products that contain nicotine or tobacco, such as cigarettes and e-cigarettes. If you need help quitting, ask your health care provider. Nicotine raises your risk for diabetes problems. If you quit using nicotine:  You will lower your risk for heart attack, stroke, nerve disease, and kidney disease.  Your cholesterol and blood pressure may improve.  Your blood circulation will improve. Keep your blood pressure under control Your personal target blood pressure is determined based on:  Your age.  Your medicines.  How long you have had diabetes.  Any other medical conditions you have. To control your blood pressure:  Follow instructions from your health care provider about meal planning, exercise, and medicines.  Make sure your health care provider  checks your blood pressure at every medical visit.  Monitor your blood pressure at home as told by your health care provider.  Keep your cholesterol under control To control your cholesterol:  Follow instructions from your health care provider about meal planning, exercise, and medicines.  Have your cholesterol checked at least once a year.  You may be prescribed medicine to lower cholesterol (statin). If you are not taking a statin, ask your health care provider if you should be. Controlling your cholesterol may:  Help prevent heart disease and stroke. These are the most common health problems for people with diabetes.  Improve your blood flow. Schedule and keep yearly physical exams and eye exams Your health care provider will tell you how often you need medical visits depending on your diabetes management plan. Keep all follow-up visits as directed. This is important so possible problems can be identified early and complications can be avoided or treated.  Every visit with your health care provider should include measuring your: ? Weight. ? Blood pressure. ? Blood glucose control.  Your A1c (hemoglobin A1c) level should be checked: ? At least 2 times a year, if you are meeting your treatment goals. ? 4 times a year, if you are not meeting treatment goals or if your treatment goals have changed.  Your blood lipids (lipid profile) should be checked yearly. You should also be checked yearly for protein in your urine (urine microalbumin).  If you have type 1 diabetes, get an eye exam 3-5 years after you are diagnosed, and then once a year after your first exam.  If you have type 2 diabetes, get an eye exam as soon as you  are diagnosed, and then once a year after your first exam. Keep your vaccines current It is recommended that you receive:  A flu (influenza) vaccine every year.  A pneumonia (pneumococcal) vaccine and a hepatitis B vaccine. If you are age 46 or older, you may  get the pneumonia vaccine as a series of two separate shots. Ask your health care provider which other vaccines may be recommended. Take care of your feet Diabetes may cause you to have poor blood circulation to your legs and feet. Because of this, taking care of your feet is very important. Diabetes can cause:  The skin on the feet to get thinner, break more easily, and heal more slowly.  Nerve damage in your legs and feet, which results in decreased feeling. You may not notice minor injuries that could lead to serious problems. To avoid foot problems:  Check your skin and feet every day for cuts, bruises, redness, blisters, or sores.  Schedule a foot exam with your health care provider once every year. This exam includes: ? Inspecting of the structure and skin of your feet. ? Checking the pulses and sensation in your feet.  Make sure that your health care provider performs a visual foot exam at every medical visit.  Take care of your teeth People with poorly controlled diabetes are more likely to have gum (periodontal) disease. Diabetes can make periodontal diseases harder to control. If not treated, periodontal diseases can lead to tooth loss. To prevent this:  Brush your teeth twice a day.  Floss at least once a day.  Visit your dentist 2 times a year. Drink responsibly Limit alcohol intake to no more than 1 drink a day for nonpregnant women and 2 drinks a day for men. One drink equals 12 oz of beer, 5 oz of wine, or 1 oz of hard liquor.  It is important to eat food when you drink alcohol to avoid low blood glucose (hypoglycemia). Avoid alcohol if you:  Have a history of alcohol abuse or dependence.  Are pregnant.  Have liver disease, pancreatitis, advanced neuropathy, or severe hypertriglyceridemia. Lessen stress Living with diabetes can be stressful. When you are experiencing stress, your blood glucose may be affected in two ways:  Stress hormones may cause your blood  glucose to rise.  You may be distracted from taking good care of yourself. Be aware of your stress level and make changes to help you manage challenging situations. To lower your stress levels:  Consider joining a support group.  Do planned relaxation or meditation.  Do a hobby that you enjoy.  Maintain healthy relationships.  Exercise regularly.  Work with your health care provider or a mental health professional. Summary  You can take action to prevent or slow down problems that are caused by diabetes (diabetes mellitus). Following your diabetes plan and taking care of yourself can reduce your risk of serious or life-threatening complications.  Follow instructions from your health care providers about managing your diabetes. Your diabetes may be managed by a team of health care providers who can teach you how to care for yourself and can answer questions that you have.  Your health care provider will tell you how often you need medical visits depending on your diabetes management plan. Keep all follow-up visits as directed. This is important so possible problems can be identified early and complications can be avoided or treated. This information is not intended to replace advice given to you by your health care provider. Make sure  you discuss any questions you have with your health care provider. Document Revised: 06/04/2017 Document Reviewed: 12/04/2015 Elsevier Patient Education  2020 Elsevier Inc. Rosuvastatin Tablets What is this medicine? ROSUVASTATIN (roe SOO va sta tin) is known as a HMG-CoA reductase inhibitor or 'statin'. It lowers cholesterol and triglycerides in the blood. This drug may also reduce the risk of heart attack, stroke, or other health problems in patients with risk factors for heart disease. Diet and lifestyle changes are often used with this drug. This medicine may be used for other purposes; ask your health care provider or pharmacist if you have  questions. COMMON BRAND NAME(S): Crestor What should I tell my health care provider before I take this medicine? They need to know if you have any of these conditions:  diabetes  if you often drink alcohol  history of stroke  kidney disease  liver disease  muscle aches or weakness  thyroid disease  an unusual or allergic reaction to rosuvastatin, other medicines, foods, dyes, or preservatives  pregnant or trying to get pregnant  breast-feeding How should I use this medicine? Take this medicine by mouth with a glass of water. Follow the directions on the prescription label. Do not cut, crush or chew this medicine. You can take this medicine with or without food. Take your doses at regular intervals. Do not take your medicine more often than directed. Talk to your pediatrician regarding the use of this medicine in children. While this drug may be prescribed for children as young as 74 years old for selected conditions, precautions do apply. Overdosage: If you think you have taken too much of this medicine contact a poison control center or emergency room at once. NOTE: This medicine is only for you. Do not share this medicine with others. What if I miss a dose? If you miss a dose, take it as soon as you can. If your next dose is to be taken in less than 12 hours, then do not take the missed dose. Take the next dose at your regular time. Do not take double or extra doses. What may interact with this medicine? Do not take this medicine with any of the following medications:  herbal medicines like red yeast rice This medicine may also interact with the following medications:  alcohol  antacids containing aluminum hydroxide or magnesium hydroxide  cyclosporine  other medicines for high cholesterol  some medicines for HIV infection  warfarin This list may not describe all possible interactions. Give your health care provider a list of all the medicines, herbs, non-prescription  drugs, or dietary supplements you use. Also tell them if you smoke, drink alcohol, or use illegal drugs. Some items may interact with your medicine. What should I watch for while using this medicine? Visit your doctor or health care professional for regular check-ups. You may need regular tests to make sure your liver is working properly. Your health care professional may tell you to stop taking this medicine if you develop muscle problems. If your muscle problems do not go away after stopping this medicine, contact your health care professional. Do not become pregnant while taking this medicine. Women should inform their health care professional if they wish to become pregnant or think they might be pregnant. There is a potential for serious side effects to an unborn child. Talk to your health care professional or pharmacist for more information. Do not breast-feed an infant while taking this medicine. This medicine may increase blood sugar. Ask your healthcare provider  if changes in diet or medicines are needed if you have diabetes. If you are going to need surgery or other procedure, tell your doctor that you are using this medicine. This drug is only part of a total heart-health program. Your doctor or a dietician can suggest a low-cholesterol and low-fat diet to help. Avoid alcohol and smoking, and keep a proper exercise schedule. This medicine may cause a decrease in Co-Enzyme Q-10. You should make sure that you get enough Co-Enzyme Q-10 while you are taking this medicine. Discuss the foods you eat and the vitamins you take with your health care professional. What side effects may I notice from receiving this medicine? Side effects that you should report to your doctor or health care professional as soon as possible:  allergic reactions like skin rash, itching or hives, swelling of the face, lips, or tongue  confusion  joint pain  loss of memory  redness, blistering, peeling or loosening of  the skin, including inside the mouth  signs and symptoms of high blood sugar such as being more thirsty or hungry or having to urinate more than normal. You may also feel very tired or have blurry vision.  signs and symptoms of muscle injury like dark urine; trouble passing urine or change in the amount of urine; unusually weak or tired; muscle pain or side or back pain  yellowing of the eyes or skin Side effects that usually do not require medical attention (report to your doctor or health care professional if they continue or are bothersome):  constipation  diarrhea  dizziness  gas  headache  nausea  stomach pain  trouble sleeping  upset stomach This list may not describe all possible side effects. Call your doctor for medical advice about side effects. You may report side effects to FDA at 1-800-FDA-1088. Where should I keep my medicine? Keep out of the reach of children. Store at room temperature between 20 and 25 degrees C (68 and 77 degrees F). Keep container tightly closed (protect from moisture). Throw away any unused medicine after the expiration date. NOTE: This sheet is a summary. It may not cover all possible information. If you have questions about this medicine, talk to your doctor, pharmacist, or health care provider.  2020 Elsevier/Gold Standard (2017-12-27 08:25:08)

## 2020-03-04 ENCOUNTER — Ambulatory Visit: Payer: Self-pay | Admitting: Nurse Practitioner

## 2020-03-06 ENCOUNTER — Encounter: Payer: Self-pay | Admitting: Nurse Practitioner

## 2020-03-31 ENCOUNTER — Other Ambulatory Visit: Payer: Self-pay

## 2020-03-31 DIAGNOSIS — Z20822 Contact with and (suspected) exposure to covid-19: Secondary | ICD-10-CM

## 2020-04-02 LAB — NOVEL CORONAVIRUS, NAA: SARS-CoV-2, NAA: NOT DETECTED

## 2020-04-02 LAB — SARS-COV-2, NAA 2 DAY TAT

## 2020-04-09 ENCOUNTER — Other Ambulatory Visit: Payer: Self-pay | Admitting: Family Medicine

## 2020-04-12 MED FILL — ?HUMALOG 100 UNITS/ML VIAL: 100 | 22 days supply | Qty: 10 | Fill #3

## 2020-04-12 MED FILL — $LANTUS SOLOSTAR 100 UNITS/: 100 | 36 days supply | Qty: 9 | Fill #3

## 2020-06-19 ENCOUNTER — Other Ambulatory Visit: Payer: Self-pay

## 2020-07-08 ENCOUNTER — Other Ambulatory Visit: Payer: Self-pay

## 2020-07-09 ENCOUNTER — Other Ambulatory Visit: Payer: Self-pay

## 2020-07-09 MED FILL — Insulin Lispro Subcutaneous Soln 100 Unit/ML: SUBCUTANEOUS | 22 days supply | Qty: 10 | Fill #0 | Status: AC

## 2020-07-12 ENCOUNTER — Other Ambulatory Visit: Payer: Self-pay

## 2020-07-15 ENCOUNTER — Other Ambulatory Visit: Payer: Self-pay

## 2020-07-20 ENCOUNTER — Other Ambulatory Visit: Payer: Self-pay

## 2020-08-24 ENCOUNTER — Other Ambulatory Visit: Payer: Self-pay | Admitting: Nurse Practitioner

## 2020-08-24 ENCOUNTER — Other Ambulatory Visit: Payer: Self-pay

## 2020-08-25 ENCOUNTER — Other Ambulatory Visit: Payer: Self-pay

## 2020-08-25 MED ORDER — BASAGLAR KWIKPEN 100 UNIT/ML ~~LOC~~ SOPN
25.0000 [IU] | PEN_INJECTOR | Freq: Every day | SUBCUTANEOUS | 11 refills | Status: AC
  Filled 2020-08-25: qty 6, 24d supply, fill #0
  Filled 2020-09-23: qty 6, 24d supply, fill #1
  Filled 2020-10-14: qty 6, 24d supply, fill #2

## 2020-09-06 ENCOUNTER — Other Ambulatory Visit: Payer: Self-pay

## 2020-09-07 ENCOUNTER — Other Ambulatory Visit: Payer: Self-pay

## 2020-09-14 ENCOUNTER — Other Ambulatory Visit: Payer: Self-pay

## 2020-09-23 ENCOUNTER — Other Ambulatory Visit: Payer: Self-pay

## 2020-09-24 ENCOUNTER — Other Ambulatory Visit: Payer: Self-pay

## 2020-09-30 IMAGING — CR CHEST - 2 VIEW
2 series · 2 of 2 positions shown · non-contrast
Comparison: None.

CLINICAL DATA: Several days of shortness of breath and RIGHT-sided
chest pain with nasal congestion.

EXAM:
CHEST - 2 VIEW

[w chest pa]
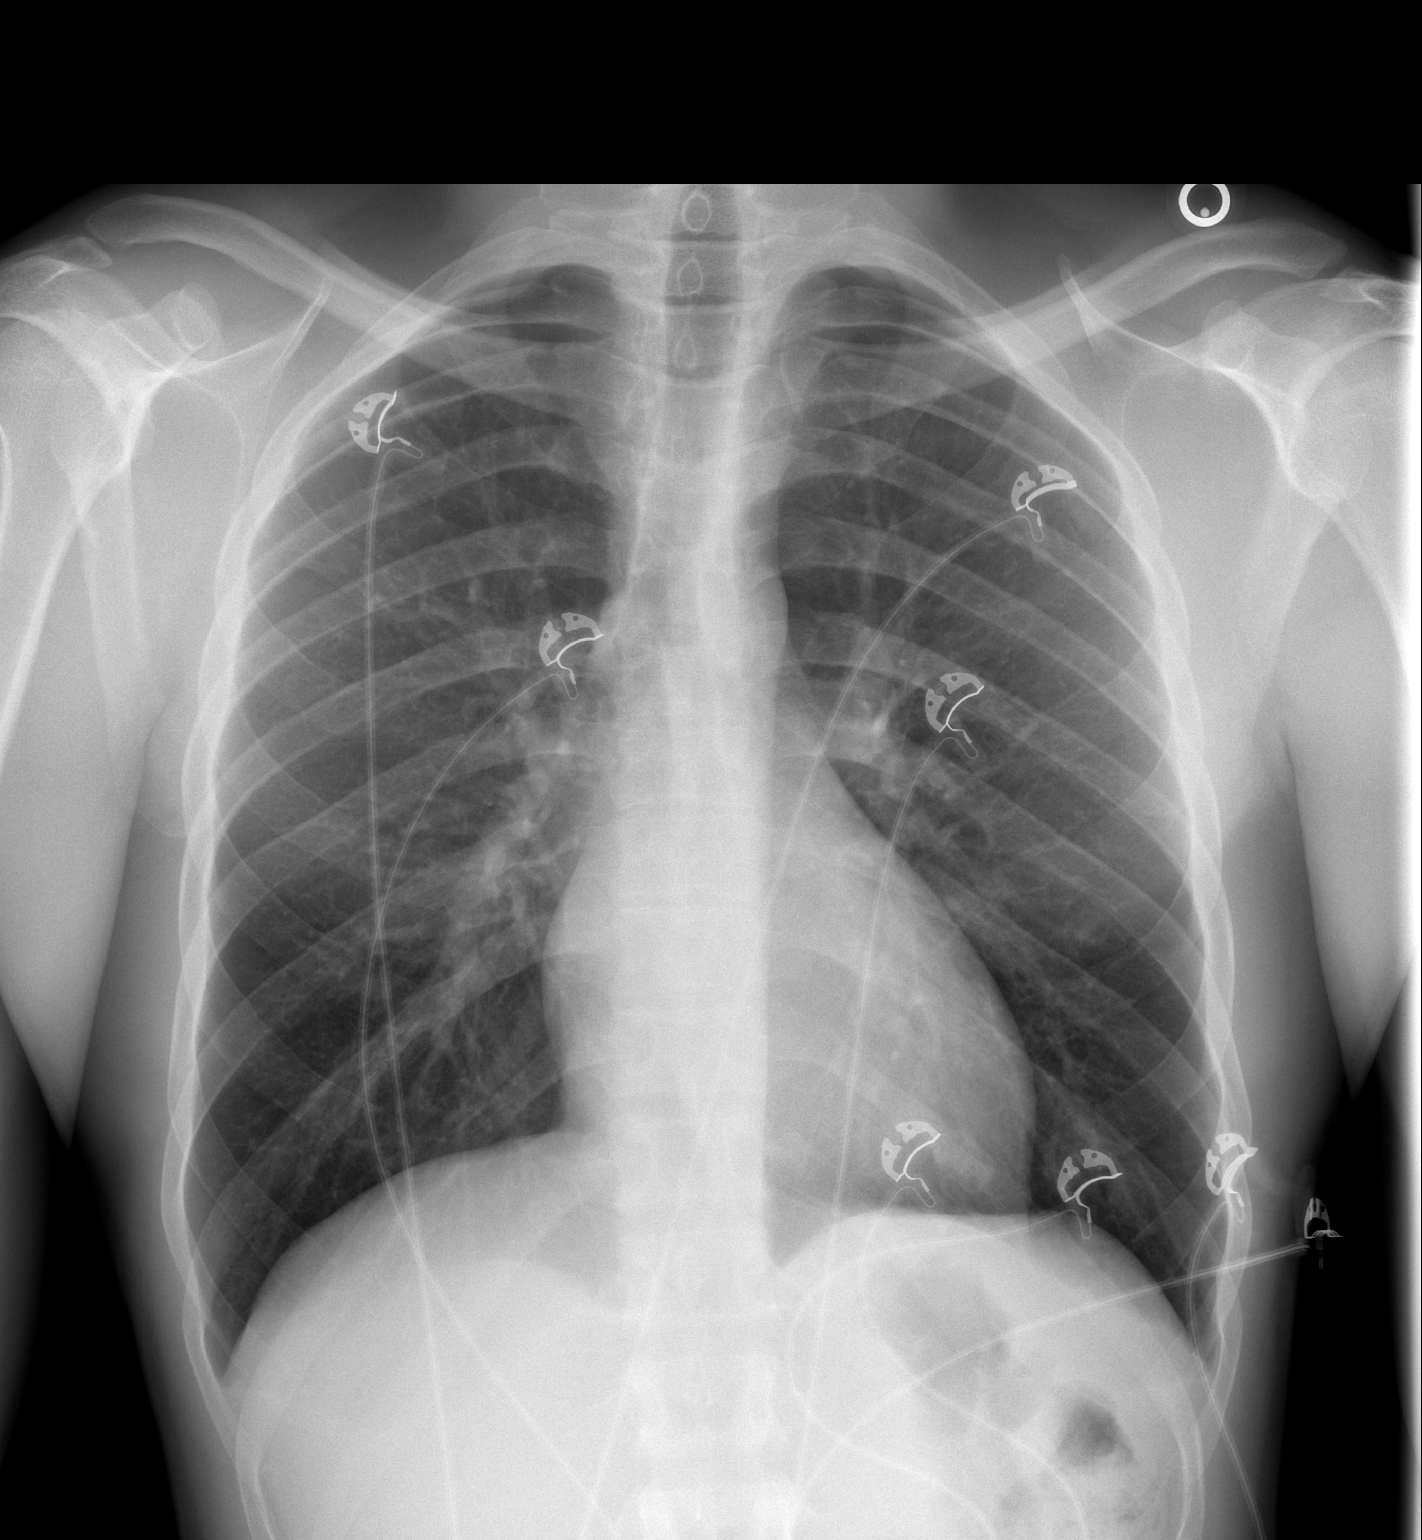

[w chest lat]
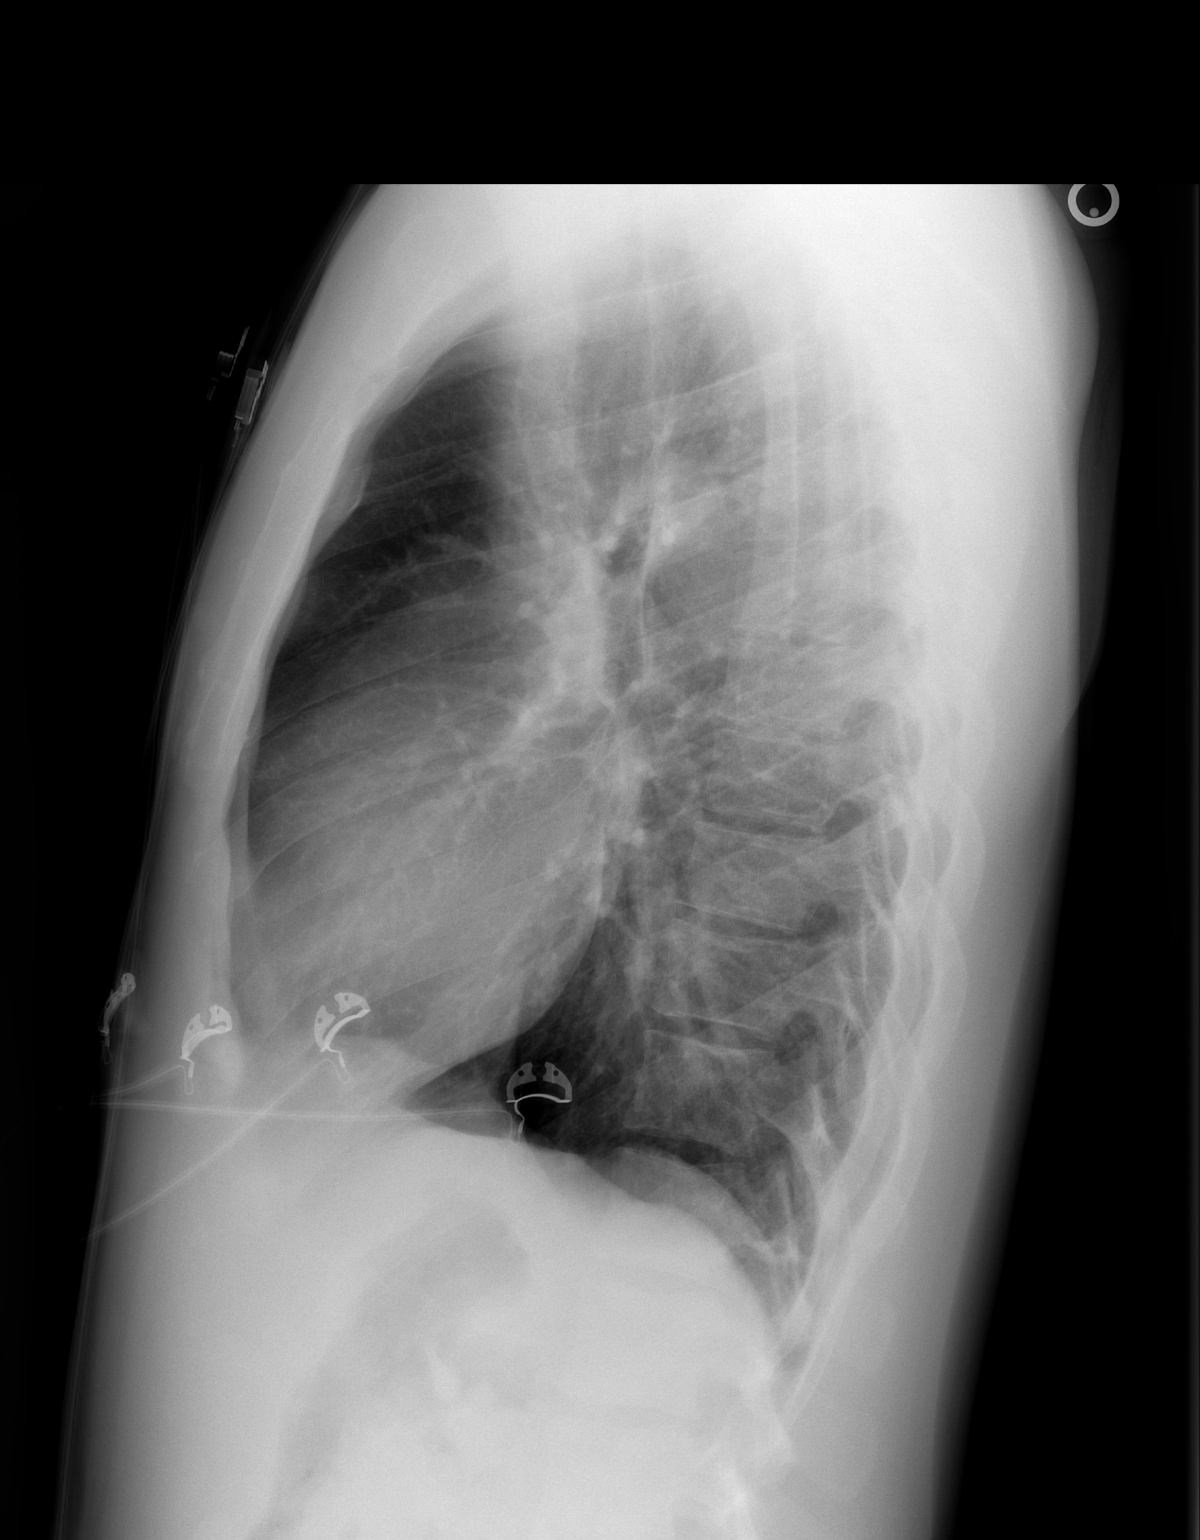

[2 of 2 positions shown; findings below may reference images not displayed]

FINDINGS: The heart size and mediastinal contours are within normal limits.
Both lungs are clear. The visualized skeletal structures are
unremarkable.
IMPRESSION: No active cardiopulmonary disease.

## 2020-10-01 ENCOUNTER — Other Ambulatory Visit: Payer: Self-pay

## 2020-10-14 ENCOUNTER — Other Ambulatory Visit: Payer: Self-pay

## 2020-10-15 ENCOUNTER — Other Ambulatory Visit: Payer: Self-pay

## 2020-10-18 ENCOUNTER — Other Ambulatory Visit: Payer: Self-pay

## 2021-01-01 IMAGING — CR CHEST - 2 VIEW
2 series · 2 of 2 positions shown · non-contrast
Comparison: July 25, 2018

CLINICAL DATA: 28-year-old male with a history of smoking and chest
pain

EXAM:
CHEST - 2 VIEW

[w chest pa]
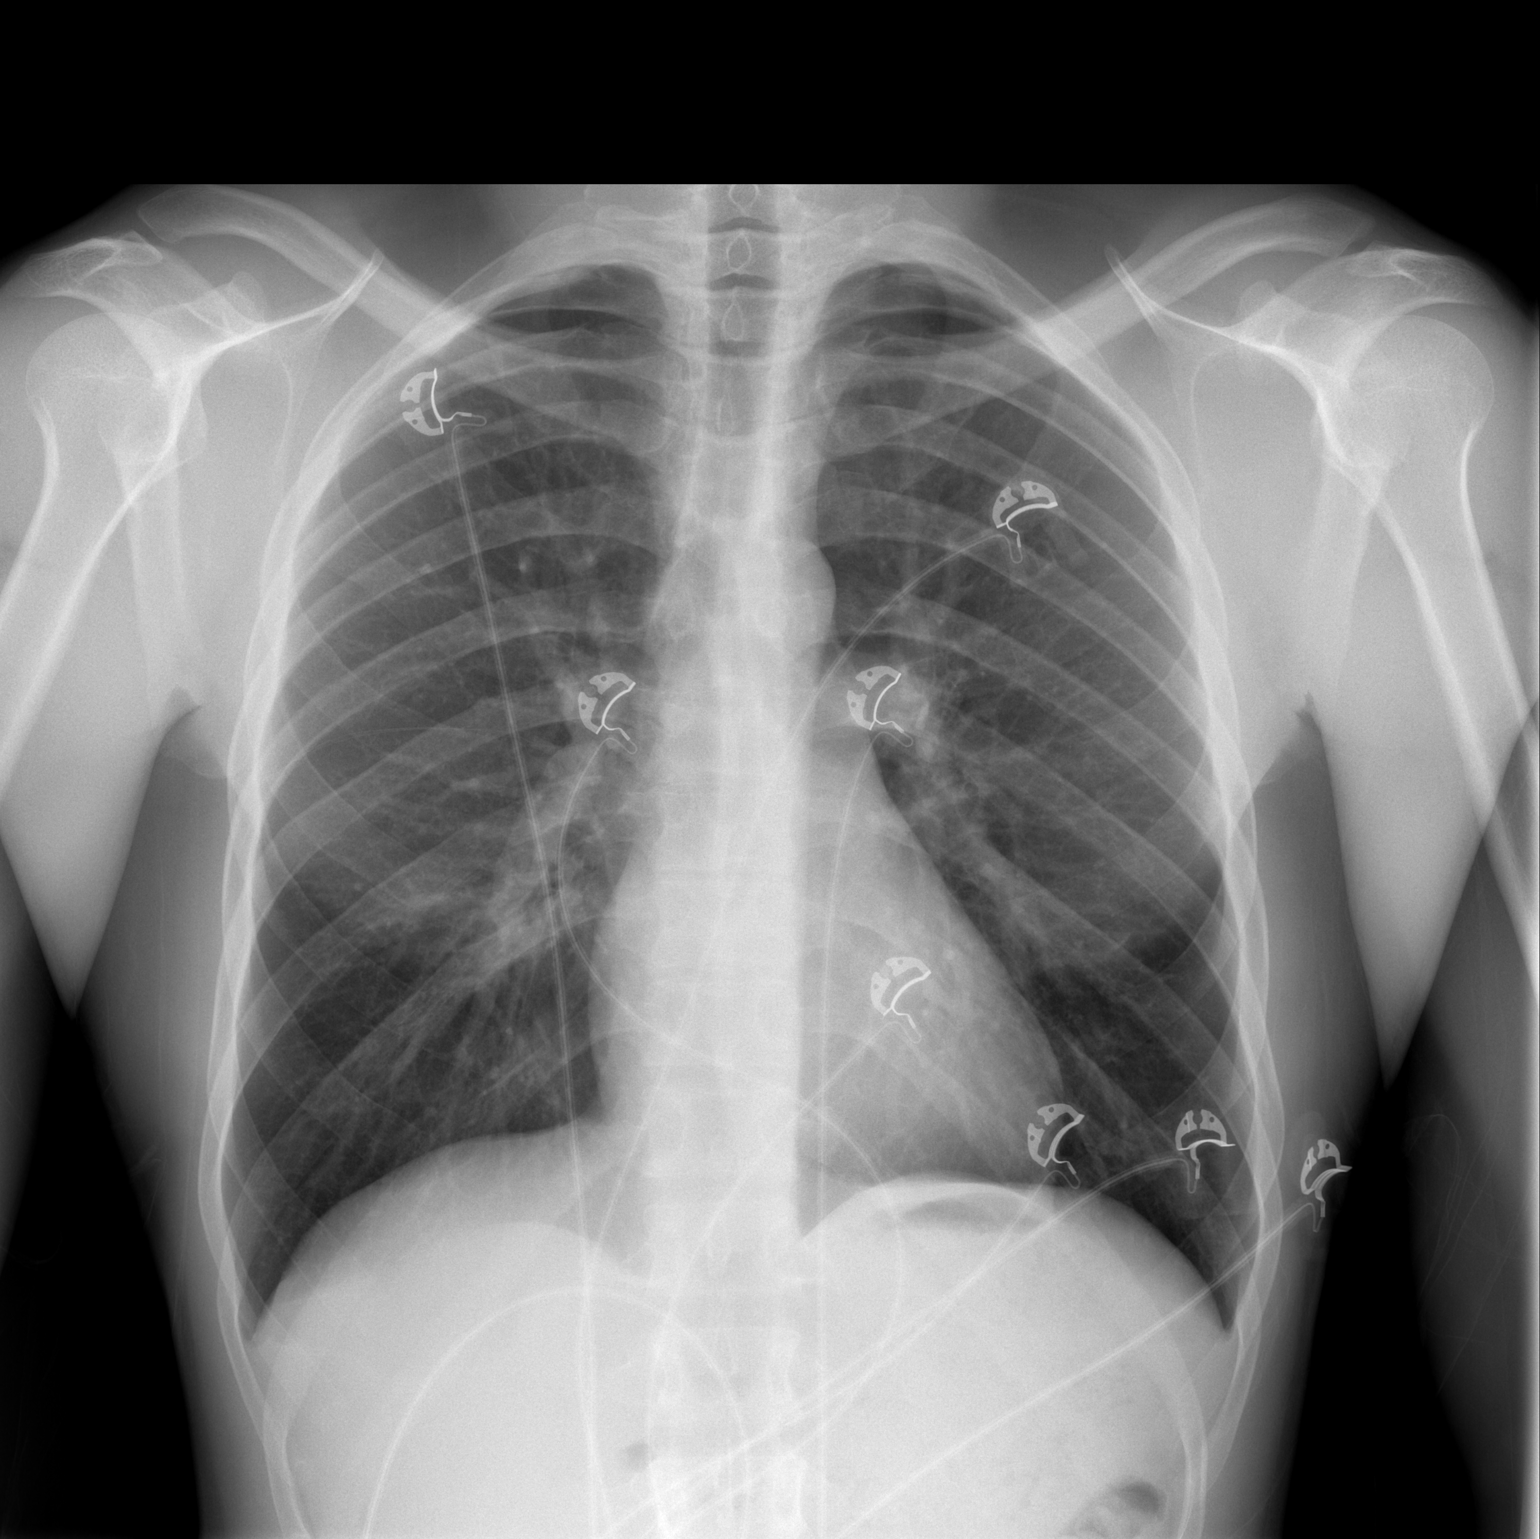

[w chest lat]
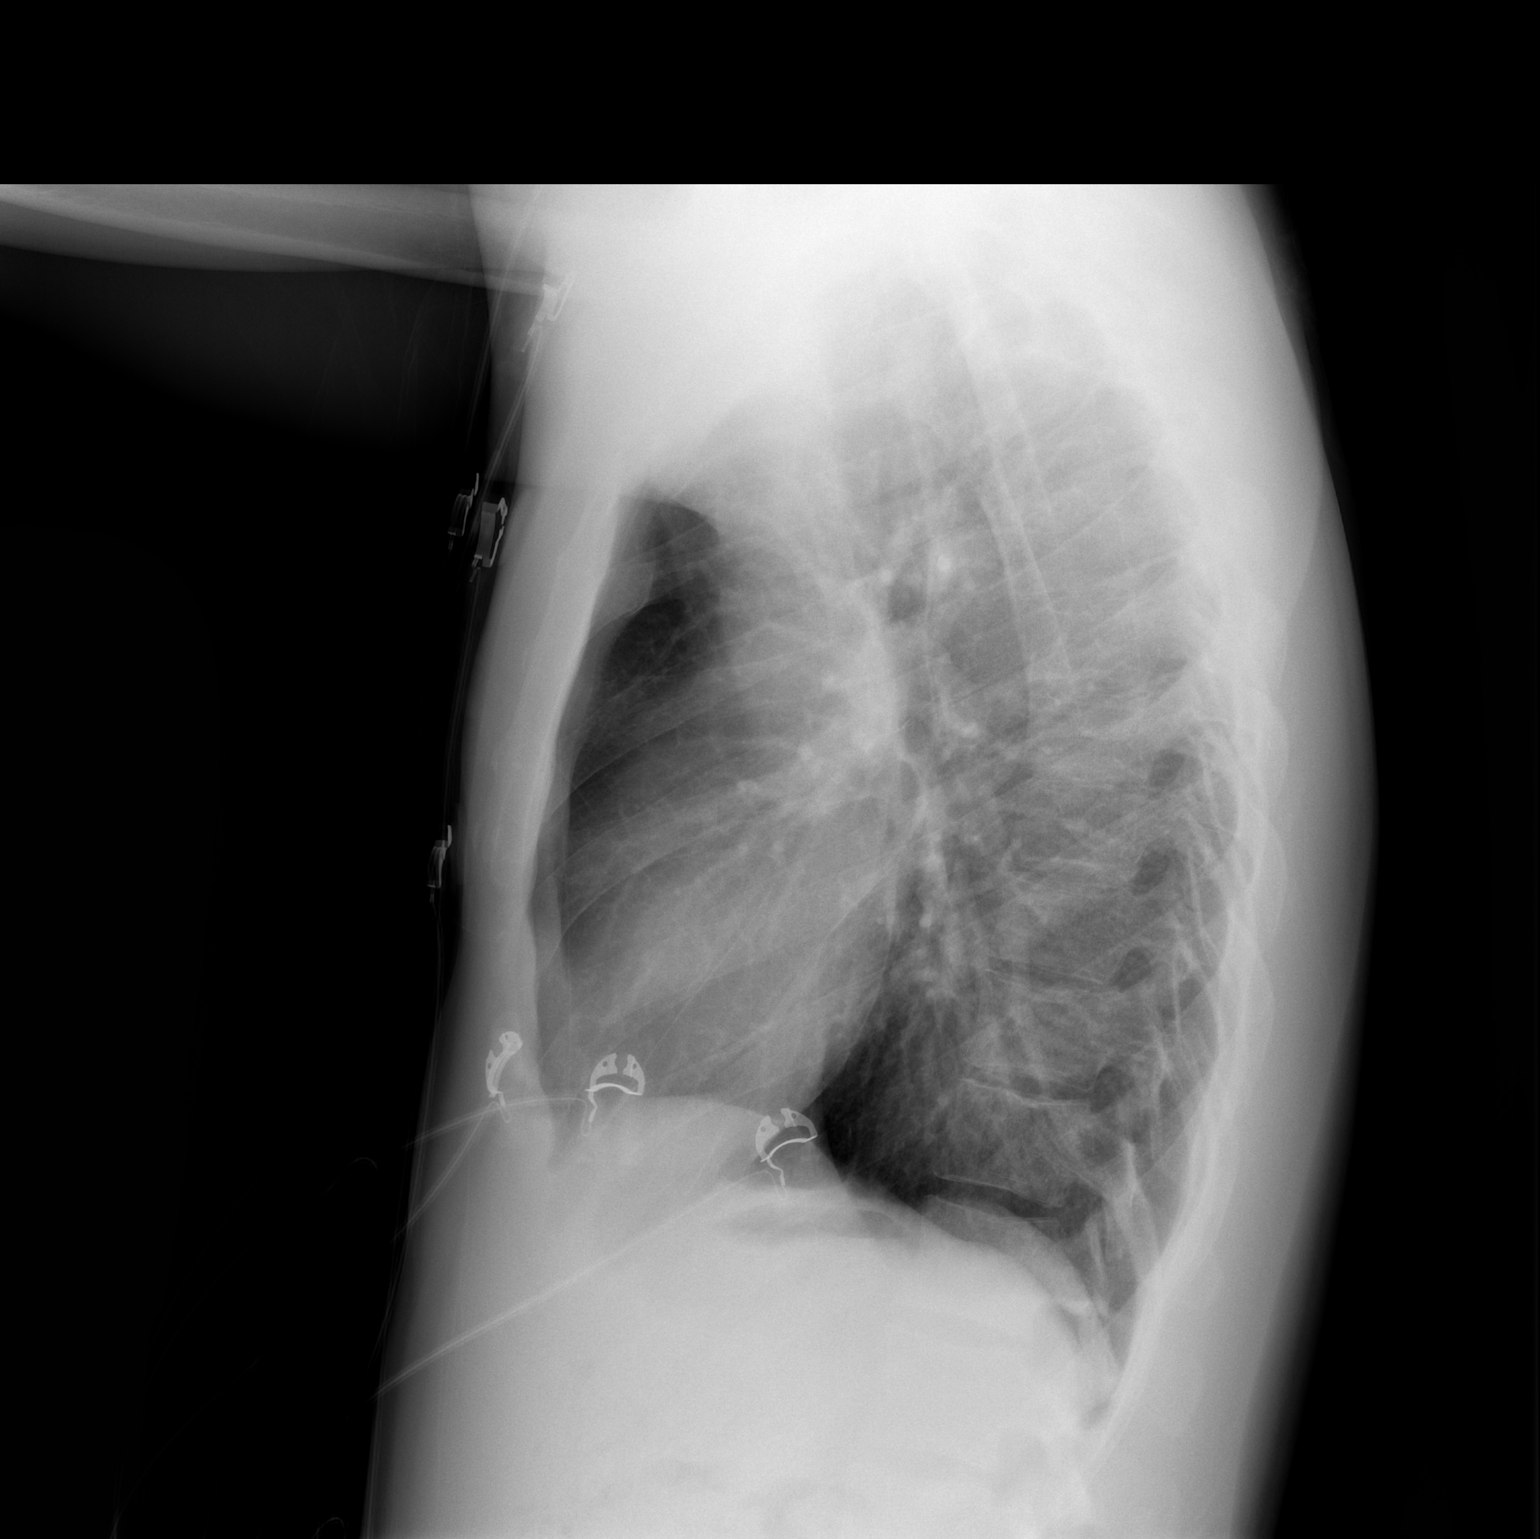

[2 of 2 positions shown; findings below may reference images not displayed]

FINDINGS: The heart size and mediastinal contours are within normal limits.
Both lungs are clear. The visualized skeletal structures are
unremarkable.
IMPRESSION: No active cardiopulmonary disease.

## 2021-04-12 ENCOUNTER — Other Ambulatory Visit: Payer: Self-pay
